# Patient Record
Sex: Female | Born: 1990 | Race: Black or African American | Hispanic: No | Marital: Single | Smoking: Former smoker
Health system: Southern US, Community
[De-identification: ages and names within clinical notes are randomized; demographics above are authoritative.]

## PROBLEM LIST (undated history)

## (undated) ENCOUNTER — Inpatient Hospital Stay (HOSPITAL_COMMUNITY): Payer: Self-pay

---

## 2010-05-07 ENCOUNTER — Emergency Department (HOSPITAL_COMMUNITY): Admission: EM | Admit: 2010-05-07 | Discharge: 2010-05-08 | Payer: Self-pay | Admitting: Emergency Medicine

## 2010-09-06 ENCOUNTER — Emergency Department (HOSPITAL_COMMUNITY)
Admission: EM | Admit: 2010-09-06 | Discharge: 2010-09-07 | Disposition: A | Discharge status: Home / Self Care | Payer: Managed Care, Other (non HMO) | Attending: Emergency Medicine | Admitting: Emergency Medicine

## 2010-09-06 DIAGNOSIS — M549 Dorsalgia, unspecified: Secondary | ICD-10-CM | POA: Insufficient documentation

## 2010-09-06 DIAGNOSIS — R51 Headache: Secondary | ICD-10-CM | POA: Insufficient documentation

## 2010-09-06 DIAGNOSIS — M25519 Pain in unspecified shoulder: Secondary | ICD-10-CM | POA: Insufficient documentation

## 2010-09-22 LAB — DIFFERENTIAL
Basophils Absolute: 0.1 10*3/uL (ref 0.0–0.1)
Eosinophils Relative: 1 % (ref 0–5)
Lymphocytes Relative: 27 % (ref 12–46)
Lymphs Abs: 2.3 10*3/uL (ref 0.7–4.0)
Monocytes Absolute: 0.8 10*3/uL (ref 0.1–1.0)
Neutro Abs: 5.3 10*3/uL (ref 1.7–7.7)

## 2010-09-22 LAB — CBC
HCT: 36.4 % (ref 36.0–46.0)
MCV: 86.5 fL (ref 78.0–100.0)
Platelets: 313 10*3/uL (ref 150–400)
RBC: 4.21 MIL/uL (ref 3.87–5.11)
WBC: 8.5 10*3/uL (ref 4.0–10.5)

## 2010-09-22 LAB — ABO/RH: ABO/RH(D): O POS

## 2010-09-22 LAB — RPR: RPR Ser Ql: NONREACTIVE

## 2010-09-22 LAB — HCG, QUANTITATIVE, PREGNANCY: hCG, Beta Chain, Quant, S: 3766 m[IU]/mL — ABNORMAL HIGH (ref <5)

## 2010-09-22 LAB — GC/CHLAMYDIA PROBE AMP, GENITAL: Chlamydia, DNA Probe: NEGATIVE

## 2010-09-22 LAB — WET PREP, GENITAL

## 2011-03-30 ENCOUNTER — Other Ambulatory Visit: Payer: Self-pay | Admitting: Internal Medicine

## 2011-03-30 ENCOUNTER — Other Ambulatory Visit (HOSPITAL_COMMUNITY)
Admission: RE | Admit: 2011-03-30 | Discharge: 2011-03-30 | Disposition: A | Discharge status: Home / Self Care | Payer: Managed Care, Other (non HMO) | Source: Ambulatory Visit | Attending: Internal Medicine | Admitting: Internal Medicine

## 2011-03-30 DIAGNOSIS — Z01419 Encounter for gynecological examination (general) (routine) without abnormal findings: Secondary | ICD-10-CM | POA: Insufficient documentation

## 2012-07-05 LAB — OB RESULTS CONSOLE GC/CHLAMYDIA
Chlamydia: NEGATIVE
Gonorrhea: NEGATIVE

## 2012-07-05 LAB — OB RESULTS CONSOLE RPR: RPR: NONREACTIVE

## 2012-07-05 LAB — OB RESULTS CONSOLE HIV ANTIBODY (ROUTINE TESTING): HIV: NONREACTIVE

## 2012-07-12 ENCOUNTER — Encounter (HOSPITAL_COMMUNITY): Payer: Self-pay | Admitting: Adult Health

## 2012-07-12 ENCOUNTER — Emergency Department (HOSPITAL_COMMUNITY)
Admission: EM | Admit: 2012-07-12 | Discharge: 2012-07-12 | Disposition: A | Discharge status: Home / Self Care | Payer: Managed Care, Other (non HMO) | Attending: Emergency Medicine | Admitting: Emergency Medicine

## 2012-07-12 DIAGNOSIS — O209 Hemorrhage in early pregnancy, unspecified: Secondary | ICD-10-CM | POA: Insufficient documentation

## 2012-07-12 DIAGNOSIS — O200 Threatened abortion: Secondary | ICD-10-CM | POA: Insufficient documentation

## 2012-07-12 DIAGNOSIS — Z87891 Personal history of nicotine dependence: Secondary | ICD-10-CM | POA: Insufficient documentation

## 2012-07-12 LAB — CBC
HCT: 36.4 % (ref 36.0–46.0)
Hemoglobin: 12.5 g/dL (ref 12.0–15.0)
Hemoglobin: 12.5 g/dL (ref 12.0–15.0)
MCH: 29.1 pg (ref 26.0–34.0)
MCH: 29.1 pg (ref 26.0–34.0)
MCHC: 34.3 g/dL (ref 30.0–36.0)
MCHC: 34.3 g/dL (ref 30.0–36.0)
MCV: 84.8 fL (ref 78.0–100.0)
MCV: 84.8 fL (ref 78.0–100.0)
RBC: 4.29 MIL/uL (ref 3.87–5.11)
RDW: 12.3 % (ref 11.5–15.5)

## 2012-07-12 LAB — HCG, QUANTITATIVE, PREGNANCY: hCG, Beta Chain, Quant, S: 37636 m[IU]/mL — ABNORMAL HIGH (ref <5)

## 2012-07-12 LAB — TYPE AND SCREEN: ABO/RH(D): O POS

## 2012-07-12 NOTE — ED Notes (Signed)
Unable to find [facility]

## 2012-07-12 NOTE — ED Provider Notes (Signed)
History     CSN: [id number]  Arrival date & time 07/12/12  0053   First MD Initiated Contact with Patient 07/12/12 0407      Chief Complaint  Patient presents with  . Vaginal Bleeding    (Consider location/radiation/quality/duration/timing/severity/associated sxs/prior treatment) HPI History provided by patient. Is [redacted] weeks pregnant followed by OB/GYN Dr. Jarold Motto, has had first ultrasound documented IUP. She is G3 P0 with 2 previous miscarriages.  Tonight noticed some spotting and mild vaginal bleeding. No associated cramping. No back pain. No dysuria. No fevers or chills. Symptoms mild/moderate in severity. Had similar presentation with previous miscarriages. No syncope. No dyspnea. No history of anemia. History reviewed. No pertinent past medical history.  History reviewed. No pertinent past surgical history.  History reviewed. No pertinent family history.  History  Substance Use Topics  . Smoking status: Former Games developer  . Smokeless tobacco: Not on file  . Alcohol Use: No    OB History    Grav Para Term Preterm Abortions TAB SAB Ect Mult Living                  Review of Systems  Constitutional: Negative for fever and chills.  HENT: Negative for neck pain and neck stiffness.   Eyes: Negative for pain.  Respiratory: Negative for shortness of breath.   Cardiovascular: Negative for chest pain.  Gastrointestinal: Negative for abdominal pain.  Genitourinary: Positive for vaginal bleeding. Negative for dysuria.  Musculoskeletal: Negative for back pain.  Skin: Negative for rash.  Neurological: Negative for headaches.  All other systems reviewed and are negative.    Allergies  Review of patient's allergies indicates no known allergies.  Home Medications  No current outpatient prescriptions on file.  BP 133/83  Pulse 83  Temp 98.1 F (36.7 C) (Oral)  Resp 18  SpO2 99%  Physical Exam  Constitutional: She is oriented to person, place, and time. She appears  well-developed and well-nourished.  HENT:  Head: Normocephalic and atraumatic.  Eyes: EOM are normal. Pupils are equal, round, and reactive to light.  Neck: Neck supple.  Cardiovascular: Normal rate, regular rhythm and intact distal pulses.   Pulmonary/Chest: Effort normal and breath sounds normal. No respiratory distress.  Abdominal: Soft. Bowel sounds are normal. She exhibits no distension. There is no tenderness. There is no rebound.  Musculoskeletal: Normal range of motion. She exhibits no edema.  Neurological: She is alert and oriented to person, place, and time.  Skin: Skin is warm and dry.    ED Course  [place] bedside Date/Time: 07/12/2012 4:24 AM Performed by: Sunnie Nielsen Authorized by: Sunnie Nielsen Consent: Verbal consent obtained. Consent given by: patient Patient understanding: patient states understanding of the procedure being performed Patient consent: the patient's understanding of the procedure matches consent given Procedure consent: procedure consent matches procedure scheduled Required items: required blood products, implants, devices, and special equipment available Patient identity confirmed: verbally with patient Time out: Immediately prior to procedure a "time out" was called to verify the correct patient, procedure, equipment, support staff and site/side marked as required. Comments: Transabdominal first trimester ultrasound performed. IUP identified with fetal heart rate 176. Fetal activity present. CRL consistent with stated dates   (including critical care time)  Results for orders placed during the hospital encounter of 07/12/12  CBC      Component Value Range   WBC 9.7  4.0 - 10.5 K/uL   RBC 4.29  3.87 - 5.11 MIL/uL   Hemoglobin 12.5  12.0 - 15.0  g/dL   HCT 09.8  11.9 - 14.7 %   MCV 84.8  78.0 - 100.0 fL   MCH 29.1  26.0 - 34.0 pg   MCHC 34.3  30.0 - 36.0 g/dL   RDW 82.9  56.2 - 13.0 %   Platelets 249  150 - 400 K/uL  HCG, QUANTITATIVE, PREGNANCY       Component Value Range   hCG, Beta Chain, Quant, [place] [address] (*) <5 mIU/mL  TYPE AND SCREEN      Component Value Range   ABO/RH(D) O POS     Antibody Screen NEG     Sample Expiration 07/15/2012     No abdominal pain or tenderness serial abdominal exams. Labs as above MDM   Vaginal bleeding first trimester, threatened miscarriage  Quantitative beta hCG and labs reviewed as above. Plan followup OB/GYN in 48 hours for recheck. Vital signs and nursing notes reviewed. Return precautions verbalized as understood.      Sunnie Nielsen, MD 07/12/12 [phone]

## 2012-07-12 NOTE — ED Notes (Signed)
Presents with [redacted] weeks pregnant vaginal bleeding,  moderate amount of blood on panty liner. Began at 11 pm, denies abdominal cramping, Hx of 2 miscarriages.

## 2012-07-12 NOTE — ED Notes (Signed)
Pt states she has has 2 miscarriages. Pt states she has had vaginal bleeding since 11 pm. Pt denies abdominal pain. Pt alert and oriented. Pt states she has bleed through 2 panty liners. Doppler performed  By 2 RNs. No fetal tones heard. MD notified. Ultrasound at bedside for MD

## 2012-09-17 ENCOUNTER — Inpatient Hospital Stay (HOSPITAL_COMMUNITY)
Admission: AD | Admit: 2012-09-17 | Discharge: 2012-09-19 | DRG: 781 | Disposition: A | Discharge status: Home / Self Care | Payer: Managed Care, Other (non HMO) | Source: Ambulatory Visit | Attending: Obstetrics and Gynecology | Admitting: Obstetrics and Gynecology

## 2012-09-17 ENCOUNTER — Encounter (HOSPITAL_COMMUNITY): Payer: Self-pay

## 2012-09-17 DIAGNOSIS — O10019 Pre-existing essential hypertension complicating pregnancy, unspecified trimester: Secondary | ICD-10-CM | POA: Diagnosis present

## 2012-09-17 DIAGNOSIS — O3430 Maternal care for cervical incompetence, unspecified trimester: Principal | ICD-10-CM | POA: Diagnosis present

## 2012-09-17 DIAGNOSIS — N883 Incompetence of cervix uteri: Secondary | ICD-10-CM

## 2012-09-17 DIAGNOSIS — O26879 Cervical shortening, unspecified trimester: Secondary | ICD-10-CM | POA: Diagnosis present

## 2012-09-17 LAB — CBC
HCT: 33.7 % — ABNORMAL LOW (ref 36.0–46.0)
MCHC: 33.5 g/dL (ref 30.0–36.0)
Platelets: 231 10*3/uL (ref 150–400)
RDW: 12.8 % (ref 11.5–15.5)
WBC: 10.2 10*3/uL (ref 4.0–10.5)

## 2012-09-17 MED ORDER — ACETAMINOPHEN 325 MG PO TABS: 650.0000 mg | ORAL_TABLET | ORAL | Status: DC | PRN

## 2012-09-17 MED ORDER — DOCUSATE SODIUM 100 MG PO CAPS
100.0000 mg | ORAL_CAPSULE | Freq: Every day | ORAL | Status: DC
  Filled 2012-09-17: qty 1

## 2012-09-17 MED ORDER — ZOLPIDEM TARTRATE 5 MG PO TABS: 5.0000 mg | ORAL_TABLET | Freq: Every evening | ORAL | Status: DC | PRN

## 2012-09-17 MED ORDER — SODIUM CHLORIDE 0.9 % IV SOLN
3.0000 g | Freq: Four times a day (QID) | INTRAVENOUS | Status: DC
  Administered 2012-09-17 – 2012-09-19 (×7): 3 g via INTRAVENOUS
  Filled 2012-09-17 (×10): qty 3

## 2012-09-17 MED ORDER — INDOMETHACIN 25 MG PO CAPS
25.0000 mg | ORAL_CAPSULE | Freq: Four times a day (QID) | ORAL | Status: DC
  Administered 2012-09-18 (×3): 25 mg via ORAL
  Filled 2012-09-17 (×7): qty 1

## 2012-09-17 MED ORDER — PRENATAL MULTIVITAMIN CH: 1.0000 | ORAL_TABLET | Freq: Every day | ORAL | Status: DC

## 2012-09-17 MED ORDER — CALCIUM CARBONATE ANTACID 500 MG PO CHEW: 2.0000 | CHEWABLE_TABLET | ORAL | Status: DC | PRN

## 2012-09-17 MED ORDER — INDOMETHACIN 50 MG PO CAPS
50.0000 mg | ORAL_CAPSULE | Freq: Once | ORAL | Status: AC
  Administered 2012-09-17: 50 mg via ORAL
  Filled 2012-09-17: qty 1

## 2012-09-17 MED ORDER — LACTATED RINGERS IV SOLN
INTRAVENOUS | Status: DC
  Administered 2012-09-17: 125 mL via INTRAVENOUS
  Administered 2012-09-18 – 2012-09-19 (×5): via INTRAVENOUS

## 2012-09-17 NOTE — Progress Notes (Signed)
Attempt made to place peripheral IV by Mickie Bail, RN. Attempt was unsuccessful.  Pt is a difficult stick. Will notify anesthesia and request to come.

## 2012-09-17 NOTE — Progress Notes (Signed)
Notified anesthesia of need for IV placement. Currently in OR for 3 c/s.  Will be out asap

## 2012-09-17 NOTE — Plan of Care (Signed)
Problem: Consults Goal: Birthing Suites Patient Information Press F2 to bring up selections list   Pt < [redacted] weeks EGA     

## 2012-09-17 NOTE — H&P (Signed)
Natalie Dunn is a 22 y.o. female G3P0020 at 19+ weeks (EDD 02/07/13 by 8 week [place]) presenting for finding of incompetent cervix with cervix open from internal os down to level of exteernal os,  Normal anatomy and growth.  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  Pt denies LOF or VB or cramping. History Past OB Hx SAB x 2  History reviewed. No pertinent past medical history. History reviewed. No pertinent past surgical history. Family History: family history is not on file. Social History:  reports that she has quit smoking. She does not have any smokeless tobacco history on file. She reports that she does not drink alcohol or use illicit drugs.   Prenatal Transfer Tool  Maternal Diabetes: unknown Genetic Screening: Declined Maternal Ultrasounds/Referrals: Abnormal:  Findings:   Other:cervical funelling Fetal Ultrasounds or other Referrals:  Referred to Materal Fetal Medicine  Maternal Substance Abuse:  No Significant Maternal Medications:  None Significant Maternal Lab Results:  None Other Comments:  None  ROS    Blood pressure 126/73, pulse 79, temperature 99.1 F (37.3 C), temperature source Oral, resp. rate 18, height 5\' 7"  (1.702 m), weight 92.987 kg (205 lb), last menstrual period 05/03/2012. Exam Physical Exam  Constitutional: She is oriented to person, place, and time. She appears well-developed and well-nourished.  Cardiovascular: Normal rate and regular rhythm.   Respiratory: Effort normal and breath sounds normal.  GI: Soft.  Genitourinary: Vagina normal and uterus normal.  Neurological: She is alert and oriented to person, place, and time.  Psychiatric: She has a normal mood and affect. Her behavior is normal.   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  Will try indocin and rescan tomorrow and plan on cerclage placement if MFM agrees.  Unasyn for prophylaxis until cerclage.  Natalie Dunn 09/17/2012, 7:28 PM

## 2012-09-18 ENCOUNTER — Inpatient Hospital Stay (HOSPITAL_COMMUNITY): Payer: Managed Care, Other (non HMO)

## 2012-09-18 NOTE — Progress Notes (Signed)
Patient ID: Natalie Dunn, female   DOB: 1991/01/28, 22 y.o.   MRN: [id number] HD #1/2 Incompetent cervix 19+weeks  Pt rested overnight in trendelenberg No LOF or VB  afeb vss Having repeat US at bedside currently to assess cervix  Pt NPO for now. Plan cerclage today or tomorrow pending [place] results and OR availability.

## 2012-09-18 NOTE — Progress Notes (Signed)
[facility] ultrasound  Indication: 22 yr old G3P0020 at [redacted]w[redacted]d with cervical shortening for ultrasound.  Findings: 1. Single intrauterine pregnancy. 2. Fetal biometry is consistent with dating. 3. Anterior placenta without evidence of previa. 4. Normal amniotic fluid volume. 5. The transvaginal cervical length is shortened to 1cm. There is funneling seen at the internal cervical os. There is debris seen in the endocervical canal. 6. The limited anatomy survey is normal.  Recommendations: 1. Appropriate fetal growth. 2. Normal limited anatomy survey: - recommend complete anatomic survey if not already performed 3. Shortened cervix: - no consult requested - patient is scheduled for a cerclage - recommend reduced activity - recommend close surveillance for signs/symptoms of PPROM/ preterm labor - consider course of betamethasone at viability  Results given to Dr. Okey Dupre, MD

## 2012-09-18 NOTE — Plan of Care (Signed)
Cerclage scheduled for 10 am 3/12.  Consent obtained and in chart; Dr Jean Rosenthal notified of posting, no orders received.

## 2012-09-19 ENCOUNTER — Inpatient Hospital Stay (HOSPITAL_COMMUNITY): Payer: Managed Care, Other (non HMO) | Admitting: Anesthesiology

## 2012-09-19 ENCOUNTER — Encounter (HOSPITAL_COMMUNITY): Payer: Self-pay | Admitting: *Deleted

## 2012-09-19 ENCOUNTER — Encounter (HOSPITAL_COMMUNITY)
Admission: AD | Disposition: A | Discharge status: Home / Self Care | Payer: Self-pay | Source: Ambulatory Visit | Attending: Obstetrics and Gynecology

## 2012-09-19 ENCOUNTER — Encounter (HOSPITAL_COMMUNITY): Payer: Self-pay | Admitting: Anesthesiology

## 2012-09-19 DIAGNOSIS — N883 Incompetence of cervix uteri: Secondary | ICD-10-CM | POA: Diagnosis present

## 2012-09-19 HISTORY — PX: CERVICAL CERCLAGE: SHX1329

## 2012-09-19 SURGERY — CERCLAGE, CERVIX, VAGINAL APPROACH
Anesthesia: Spinal | Site: Vagina | Wound class: Clean Contaminated

## 2012-09-19 MED ORDER — 0.9 % SODIUM CHLORIDE (POUR BTL) OPTIME
TOPICAL | Status: DC | PRN
  Administered 2012-09-19: 1000 mL

## 2012-09-19 MED ORDER — ONDANSETRON HCL 4 MG/2ML IJ SOLN
INTRAMUSCULAR | Status: DC | PRN
  Administered 2012-09-19: 4 mg via INTRAVENOUS

## 2012-09-19 MED ORDER — FENTANYL CITRATE 0.05 MG/ML IJ SOLN: 25.0000 ug | INTRAMUSCULAR | Status: DC | PRN

## 2012-09-19 MED ORDER — PHENYLEPHRINE 40 MCG/ML (10ML) SYRINGE FOR IV PUSH (FOR BLOOD PRESSURE SUPPORT)
PREFILLED_SYRINGE | INTRAVENOUS | Status: AC
  Filled 2012-09-19: qty 5

## 2012-09-19 MED ORDER — DSS 100 MG PO CAPS: 100.0000 mg | ORAL_CAPSULE | Freq: Two times a day (BID) | ORAL | Status: DC

## 2012-09-19 MED ORDER — ONDANSETRON HCL 4 MG/2ML IJ SOLN
INTRAMUSCULAR | Status: AC
  Filled 2012-09-19: qty 2

## 2012-09-19 MED ORDER — LIDOCAINE IN DEXTROSE 5-7.5 % IV SOLN
INTRAVENOUS | Status: AC
  Filled 2012-09-19: qty 2

## 2012-09-19 MED ORDER — EPHEDRINE 5 MG/ML INJ
INTRAVENOUS | Status: AC
  Filled 2012-09-19: qty 10

## 2012-09-19 MED ORDER — EPHEDRINE SULFATE 50 MG/ML IJ SOLN
INTRAMUSCULAR | Status: DC | PRN
  Administered 2012-09-19: 5 mg via INTRAVENOUS
  Administered 2012-09-19: 10 mg via INTRAVENOUS
  Administered 2012-09-19 (×2): 5 mg via INTRAVENOUS

## 2012-09-19 MED ORDER — STERILE WATER FOR IRRIGATION IR SOLN
Status: DC | PRN
  Administered 2012-09-19: 1000 mL via INTRAVESICAL

## 2012-09-19 MED ORDER — PHENYLEPHRINE HCL 10 MG/ML IJ SOLN
INTRAMUSCULAR | Status: DC | PRN
  Administered 2012-09-19 (×4): 40 ug via INTRAVENOUS
  Administered 2012-09-19: 80 ug via INTRAVENOUS
  Administered 2012-09-19: 40 ug via INTRAVENOUS
  Administered 2012-09-19: 80 ug via INTRAVENOUS
  Administered 2012-09-19: 40 ug via INTRAVENOUS

## 2012-09-19 SURGICAL SUPPLY — 16 items
CATH ROBINSON RED A/P 16FR (CATHETERS) IMPLANT
CLOTH BEACON ORANGE TIMEOUT ST (SAFETY) ×2 IMPLANT
COUNTER NEEDLE 1200 MAGNETIC (NEEDLE) IMPLANT
GLOVE BIO SURGEON STRL SZ 6.5 (GLOVE) ×2 IMPLANT
GOWN STRL REIN XL XLG (GOWN DISPOSABLE) ×4 IMPLANT
NEEDLE MAYO .5 CIRCLE (NEEDLE) ×2 IMPLANT
PACK VAGINAL MINOR WOMEN LF (CUSTOM PROCEDURE TRAY) ×2 IMPLANT
PAD OB MATERNITY 4.3X12.25 (PERSONAL CARE ITEMS) ×2 IMPLANT
PAD PREP 24X48 CUFFED NSTRL (MISCELLANEOUS) ×2 IMPLANT
SET CYSTO W/LG BORE CLAMP LF (SET/KITS/TRAYS/PACK) ×2 IMPLANT
SUT POLYDEK 5 CE 75 36 (SUTURE) ×4 IMPLANT
SYR BULB IRRIGATION 50ML (SYRINGE) ×2 IMPLANT
TOWEL OR 17X24 6PK STRL BLUE (TOWEL DISPOSABLE) ×4 IMPLANT
TUBING NON-CON 1/4 X 20 CONN (TUBING) ×2 IMPLANT
WATER STERILE IRR 1000ML POUR (IV SOLUTION) ×2 IMPLANT
YANKAUER SUCT BULB TIP NO VENT (SUCTIONS) ×2 IMPLANT

## 2012-09-19 NOTE — Brief Op Note (Signed)
09/17/2012 - 09/19/2012  11:36 AM  PATIENT:  Natalie Dunn  22 y.o. female  PRE-OPERATIVE DIAGNOSIS:  incompetent cervix with funneling  POST-OPERATIVE DIAGNOSIS:  incomplete cervix with funneling  PROCEDURE:  Procedure(s): CERCLAGE CERVICAL (N/A)  SURGEON:  Surgeon(s) and Role:    * Oliver Pila, MD - Primary   ANESTHESIA:   spinal  EBL:  Total I/O In: 2300 [I.V.:2300] Out: 600 [Urine:600]  BLOOD ADMINISTERED:none  DRAINS: none   LOCAL MEDICATIONS USED:  NONE  SPECIMEN:  No Specimen  DISPOSITION OF SPECIMEN:  N/A  COUNTS:  YES  TOURNIQUET:  * No tourniquets in log *  DICTATION: .Dragon Dictation  PLAN OF CARE: Discharge to home after PACU  PATIENT DISPOSITION:  PACU - hemodynamically stable.

## 2012-09-19 NOTE — Transfer of Care (Signed)
Immediate Anesthesia Transfer of Care Note  Patient: Matha Masse  Procedure(s) Performed: Procedure(s): CERCLAGE CERVICAL (N/A)  Patient Location: PACU  Anesthesia Type:Spinal  Level of Consciousness: awake, alert  and oriented  Airway & Oxygen Therapy: Patient Spontanous Breathing  Post-op Assessment: Report given to PACU RN and Post -op Vital signs reviewed and stable  Post vital signs: Reviewed and stable  Complications: No apparent anesthesia complications

## 2012-09-19 NOTE — Progress Notes (Signed)
Patient ID: Natalie Dunn, female   DOB: 07-02-1991, 22 y.o.   MRN: [id number] Pt with uneventful night. Cerclage for 10am.  Questions answered. Will proceed.

## 2012-09-19 NOTE — Anesthesia Preprocedure Evaluation (Signed)

## 2012-09-19 NOTE — Anesthesia Postprocedure Evaluation (Signed)
  Anesthesia Post-op Note  Patient: Natalie Dunn  Procedure(s) Performed: Procedure(s): CERCLAGE CERVICAL (N/A)  Patient Location: PACU  Anesthesia Type:Spinal  Level of Consciousness: awake, alert  and oriented  Airway and Oxygen Therapy: Patient Spontanous Breathing  Post-op Pain: none  Post-op Assessment: Post-op Vital signs reviewed, Patient's Cardiovascular Status Stable, Respiratory Function Stable, Patent Airway, No signs of Nausea or vomiting, Pain level controlled, No headache, No backache and No residual numbness  Post-op Vital Signs: Reviewed and stable  Complications: No apparent anesthesia complications

## 2012-09-19 NOTE — Discharge Summary (Signed)
Physician Discharge Summary  Patient ID: Ronneisha Jett MRN: [id number] DOB/AGE: December 23, 1990 21 y.o.  Admit date: 09/17/2012 Discharge date: 09/19/2012  Admission Diagnoses: 19 weeks with incompetent cervix  Discharge Diagnoses:  Active Problems:   Incompetent cervix s/p Cerclage  Discharged Condition: good  Hospital Course: Pt admitted and placed in trendelenberg and placed on indocin and antibiotics for 24 hours.  She had an MFM consult that agreed with cerclage.  She underwent a Macdonald cerclage 09/19/12 without complication.  The cervix appeared closed at the conclusion of the procedure.  She will be observed in the PACU and d/c to home if does well.  Consults: MFM  Significant Diagnostic Studies: Ultrasound  Treatments: antibiotics: Unasyn  Discharge Exam: Blood pressure 106/56, pulse 81, temperature 98.9 F (37.2 C), temperature source Oral, resp. rate 20, height 5\' 7"  (1.702 m), weight 92.987 kg (205 lb), last menstrual period 05/03/2012. General appearance: alert and cooperative GI: soft, non-tender; bowel sounds normal; no masses,  no organomegaly  Disposition: 01-Home or Self Care  Discharge Orders   Future Orders Complete By Expires     Diet - low sodium heart healthy  As directed     Discharge instructions  As directed     Comments:      You are being discharged on bedrest and pelvic rest.  You should limit the time on your feet to no more than 15 minutes at a time.  No sex or anything that would cause orgasm.  Keep your appointment as scheduled in the office in the next week.    Increase activity slowly  As directed         Medication List    TAKE these medications       DSS 100 MG Caps  Take 100 mg by mouth 2 (two) times daily.     prenatal multivitamin Tabs  Take 1 tablet by mouth daily at 12 noon.           Follow-up Information   Follow up with Oliver Pila, MD In 1 week. (keep appt as scheduled)    Contact information:   [address], [address] [place] [address] [phone]       Signed: Oliver Pila 09/19/2012, 11:50 AM

## 2012-09-19 NOTE — Op Note (Signed)
Operative note  Preoperative diagnosis Pregnancy at 19 weeks and 6 days Incompetent cervix with funneling down to within 1 cm of the external os  Postoperative diagnosis Same  Procedure Cervical cerclage--MacDonald with suture knot at 12:00  Surgeon Dr. Huel Cote  Anesthesia Spinal  Fluids Estimated blood loss 50 cc Urine output 300 cc catheter IV fluids 1200 cc LR  Findings The cervix appeared shortened but still a proximally 1-1-1/2 cm in length. There was some mucus coming out of the external os but no visible membranes or bag.  There is no obvious leakage of fluid during the procedure.  Procedure note Patient was taken to the operating room where spinal anesthesia was obtained without difficulty she was then prepped and draped in the normal sterile fashion in the dorsal lithotomy position. An appropriate time out was performed. Cervical visualization was difficult secondary to the length of the patient's vagina, therefore sometime was spent findings appropriate retractors to gain visualization. With Deaver retractors anteriorly and to the side and a long Graves speculum posteriorly, we began to place a cerclage with a #1 Polydek suture on a Mayo needle. Starting at 12:00 the stitch was passed circumferentially around the cervix and secured at that position. Once the stitch was secured the cervix appeared closed and palpated closed. There is no active bleeding at the conclusion of the procedure and the patient tolerated it well. The patient was taken to the recovery room where she will recover and it does fine be discharged home on bedrest.

## 2012-09-19 NOTE — Anesthesia Procedure Notes (Addendum)
Spinal  Patient location during procedure: OR Preanesthetic Checklist Completed: patient identified, site marked, surgical consent, pre-op evaluation, timeout performed, IV checked, risks and benefits discussed and monitors and equipment checked Spinal Block Patient position: sitting Prep: DuraPrep Patient monitoring: heart rate, cardiac monitor, continuous pulse ox and blood pressure Approach: midline Location: L3-4 Injection technique: single-shot Needle Needle type: Sprotte  Needle gauge: 24 G Needle length: 9 cm Assessment Sensory level: T4 Events: paresthesia Additional Notes Left leg parasthesia noted, transient sx cleared before next pass of sprotte  Spinal Dosage in OR  Xylocaine ml       1.2

## 2012-09-20 ENCOUNTER — Encounter (HOSPITAL_COMMUNITY): Payer: Self-pay | Admitting: Obstetrics and Gynecology

## 2012-10-18 ENCOUNTER — Inpatient Hospital Stay (HOSPITAL_COMMUNITY)
Admission: RE | Admit: 2012-10-18 | Discharge: 2012-10-18 | Disposition: A | Discharge status: Home / Self Care | Payer: Managed Care, Other (non HMO) | Source: Ambulatory Visit | Attending: Obstetrics and Gynecology | Admitting: Obstetrics and Gynecology

## 2012-10-18 DIAGNOSIS — O4700 False labor before 37 completed weeks of gestation, unspecified trimester: Secondary | ICD-10-CM | POA: Insufficient documentation

## 2012-10-18 DIAGNOSIS — O3430 Maternal care for cervical incompetence, unspecified trimester: Secondary | ICD-10-CM | POA: Insufficient documentation

## 2012-10-18 MED ORDER — BETAMETHASONE SOD PHOS & ACET 6 (3-3) MG/ML IJ SUSP
12.0000 mg | Freq: Once | INTRAMUSCULAR | Status: AC
  Administered 2012-10-18: 12 mg via INTRAMUSCULAR
  Filled 2012-10-18: qty 2

## 2012-10-19 ENCOUNTER — Inpatient Hospital Stay (HOSPITAL_COMMUNITY)
Admission: AD | Admit: 2012-10-19 | Discharge: 2012-10-19 | Disposition: A | Discharge status: Home / Self Care | Payer: Managed Care, Other (non HMO) | Source: Ambulatory Visit | Attending: Obstetrics and Gynecology | Admitting: Obstetrics and Gynecology

## 2012-10-19 DIAGNOSIS — O4700 False labor before 37 completed weeks of gestation, unspecified trimester: Secondary | ICD-10-CM | POA: Insufficient documentation

## 2012-10-19 MED ORDER — BETAMETHASONE SOD PHOS & ACET 6 (3-3) MG/ML IJ SUSP
12.0000 mg | Freq: Once | INTRAMUSCULAR | Status: AC
  Administered 2012-10-19: 12 mg via INTRAMUSCULAR
  Filled 2012-10-19: qty 2

## 2012-11-11 ENCOUNTER — Encounter (HOSPITAL_COMMUNITY): Payer: Self-pay | Admitting: *Deleted

## 2012-11-11 ENCOUNTER — Inpatient Hospital Stay (HOSPITAL_COMMUNITY)
Admission: AD | Admit: 2012-11-11 | Discharge: 2012-11-12 | Disposition: A | Discharge status: Home / Self Care | Payer: Managed Care, Other (non HMO) | Source: Ambulatory Visit | Attending: Obstetrics and Gynecology | Admitting: Obstetrics and Gynecology

## 2012-11-11 DIAGNOSIS — R109 Unspecified abdominal pain: Secondary | ICD-10-CM | POA: Insufficient documentation

## 2012-11-11 DIAGNOSIS — B9689 Other specified bacterial agents as the cause of diseases classified elsewhere: Secondary | ICD-10-CM | POA: Insufficient documentation

## 2012-11-11 DIAGNOSIS — N760 Acute vaginitis: Secondary | ICD-10-CM | POA: Insufficient documentation

## 2012-11-11 DIAGNOSIS — O2390 Unspecified genitourinary tract infection in pregnancy, unspecified trimester: Secondary | ICD-10-CM | POA: Insufficient documentation

## 2012-11-11 DIAGNOSIS — A499 Bacterial infection, unspecified: Secondary | ICD-10-CM | POA: Insufficient documentation

## 2012-11-11 DIAGNOSIS — O26879 Cervical shortening, unspecified trimester: Secondary | ICD-10-CM | POA: Insufficient documentation

## 2012-11-11 DIAGNOSIS — N949 Unspecified condition associated with female genital organs and menstrual cycle: Secondary | ICD-10-CM | POA: Insufficient documentation

## 2012-11-11 LAB — URINALYSIS, ROUTINE W REFLEX MICROSCOPIC
Bilirubin Urine: NEGATIVE
Glucose, UA: NEGATIVE mg/dL
Ketones, ur: 15 mg/dL — AB
pH: 6.5 (ref 5.0–8.0)

## 2012-11-11 LAB — URINE MICROSCOPIC-ADD ON

## 2012-11-11 NOTE — MAU Note (Signed)
Pt states she has been feeling pelvic pain x 3 hours-it is constant

## 2012-11-11 NOTE — MAU Provider Note (Signed)
History     CSN: [id number]  Arrival date and time: 11/11/12 2249   First Provider Initiated Contact with Patient 11/11/12 2331      Chief Complaint  Patient presents with  . Pelvic Pain   HPI  Pt is a G3P0020 here at 27.3 wks IUP with report of left pelvic pain that started today.  Report pain worsens with ambulation and standing.  Pain is constant, not intermittent in nature.   No report of vaginal bleeding.  +yellowish discharge that required a pantyliner.  +cerclage that was placed at 17 wks IUP for shortened cervix.    Past Medical History  Diagnosis Date  . Medical history non-contributory     Past Surgical History  Procedure Laterality Date  . Cervical cerclage N/A 09/19/2012    Procedure: CERCLAGE CERVICAL;  Surgeon: Oliver Pila, MD;  Location: [facility];  Service: Gynecology;  Laterality: N/A;    History reviewed. No pertinent family history.  History  Substance Use Topics  . Smoking status: Former Games developer  . Smokeless tobacco: Not on file  . Alcohol Use: No    Allergies: No Known Allergies  Prescriptions prior to admission  Medication Sig Dispense Refill  . Prenatal Vit-Fe Fumarate-FA (PRENATAL MULTIVITAMIN) TABS Take 1 tablet by mouth daily at 12 noon.        Review of Systems  Gastrointestinal: Positive for abdominal pain (groin).  Genitourinary:       Yellowish discharge  All other systems reviewed and are negative.   Physical Exam   Blood pressure 128/96, pulse 80, temperature 98.2 F (36.8 C), temperature source Oral, resp. rate 20, height 5\' 7"  (1.702 m), weight 99.61 kg (219 lb 9.6 oz), last menstrual period 05/03/2012.  Physical Exam  Constitutional: She is oriented to person, place, and time. She appears well-developed and well-nourished. No distress.  HENT:  Head: Normocephalic.  Neck: Normal range of motion. Neck supple.  Cardiovascular: Normal rate, regular rhythm and normal heart sounds.   Respiratory: Effort normal and breath  sounds normal.  GI: Soft. There is no tenderness.  Genitourinary: No bleeding around the vagina. Vaginal discharge (white, watery discharge; fern negative) found.  Cerclage in place; cervix visually closed  Neurological: She is alert and oriented to person, place, and time.  Skin: Skin is warm and dry.    MAU Course  Procedures  Results for orders placed during the hospital encounter of 11/11/12 (from the past 24 hour(s))  URINALYSIS, ROUTINE W REFLEX MICROSCOPIC     Status: Abnormal   Collection Time    11/11/12 11:05 PM      Result Value Range   Color, Urine YELLOW  YELLOW   APPearance CLEAR  CLEAR   Specific Gravity, Urine >1.030 (*) 1.005 - 1.030   pH 6.5  5.0 - 8.0   Glucose, UA NEGATIVE  NEGATIVE mg/dL   Hgb urine dipstick NEGATIVE  NEGATIVE   Bilirubin Urine NEGATIVE  NEGATIVE   Ketones, ur 15 (*) NEGATIVE mg/dL   Protein, ur NEGATIVE  NEGATIVE mg/dL   Urobilinogen, UA 0.2  0.0 - 1.0 mg/dL   Nitrite NEGATIVE  NEGATIVE   Leukocytes, UA SMALL (*) NEGATIVE  URINE MICROSCOPIC-ADD ON     Status: Abnormal   Collection Time    11/11/12 11:05 PM      Result Value Range   Squamous Epithelial / LPF FEW (*) RARE   WBC, UA 3-6  <3 WBC/hpf   Urine-Other MUCOUS PRESENT     Consulted with Dr.  Meisinger > reviewed HPI/ob history/exam > obtain amnisure Results for orders placed during the hospital encounter of 11/11/12 (from the past 24 hour(s))  URINALYSIS, ROUTINE W REFLEX MICROSCOPIC     Status: Abnormal   Collection Time    11/11/12 11:05 PM      Result Value Range   Color, Urine YELLOW  YELLOW   APPearance CLEAR  CLEAR   Specific Gravity, Urine >1.030 (*) 1.005 - 1.030   pH 6.5  5.0 - 8.0   Glucose, UA NEGATIVE  NEGATIVE mg/dL   Hgb urine dipstick NEGATIVE  NEGATIVE   Bilirubin Urine NEGATIVE  NEGATIVE   Ketones, ur 15 (*) NEGATIVE mg/dL   Protein, ur NEGATIVE  NEGATIVE mg/dL   Urobilinogen, UA 0.2  0.0 - 1.0 mg/dL   Nitrite NEGATIVE  NEGATIVE   Leukocytes, UA SMALL  (*) NEGATIVE  URINE MICROSCOPIC-ADD ON     Status: Abnormal   Collection Time    11/11/12 11:05 PM      Result Value Range   Squamous Epithelial / LPF FEW (*) RARE   WBC, UA 3-6  <3 WBC/hpf   Urine-Other MUCOUS PRESENT    WET PREP, GENITAL     Status: Abnormal   Collection Time    11/11/12 11:52 PM      Result Value Range   Yeast Wet Prep HPF POC NONE SEEN  NONE SEEN   Trich, Wet Prep NONE SEEN  NONE SEEN   Clue Cells Wet Prep HPF POC FEW (*) NONE SEEN   WBC, Wet Prep HPF POC MODERATE (*) NONE SEEN  AMNISURE RUPTURE OF MEMBRANE (ROM)     Status: None   Collection Time    11/12/12 12:10 AM      Result Value Range   Amnisure ROM NEGATIVE     FHR 130's, + 10x10 accels Toco - mild irritability (not felt by pt) Assessment and Plan  Bacterial Vaginosis Round Ligament Pain  Plan: RX Flagyl x 1 week Follow-up in office as scheduled   Upon leaving pt blood pressures noticed to be in the 130's over 90's; no headache, vision changes or epigastric pain > consulted with Dr. Jackelyn Knife > obtain labs and have follow-up in office this week.  [facility] 11/11/2012, 11:32 PM

## 2012-11-12 DIAGNOSIS — N760 Acute vaginitis: Secondary | ICD-10-CM

## 2012-11-12 DIAGNOSIS — A499 Bacterial infection, unspecified: Secondary | ICD-10-CM

## 2012-11-12 LAB — CBC
Hemoglobin: 11.2 g/dL — ABNORMAL LOW (ref 12.0–15.0)
MCH: 29.2 pg (ref 26.0–34.0)
MCHC: 33.5 g/dL (ref 30.0–36.0)
MCV: 87.2 fL (ref 78.0–100.0)

## 2012-11-12 LAB — COMPREHENSIVE METABOLIC PANEL
ALT: 18 U/L (ref 0–35)
BUN: 8 mg/dL (ref 6–23)
Calcium: 9.4 mg/dL (ref 8.4–10.5)
Creatinine, Ser: 0.65 mg/dL (ref 0.50–1.10)
GFR calc Af Amer: >90 mL/min (ref >90)
Glucose, Bld: 106 mg/dL — ABNORMAL HIGH (ref 70–99)
Sodium: 134 mEq/L — ABNORMAL LOW (ref 135–145)
Total Protein: 6 g/dL (ref 6.0–8.3)

## 2012-11-12 LAB — WET PREP, GENITAL: Yeast Wet Prep HPF POC: NONE SEEN

## 2012-11-12 MED ORDER — METRONIDAZOLE 500 MG PO TABS: 500.0000 mg | ORAL_TABLET | Freq: Two times a day (BID) | ORAL | Status: DC

## 2012-11-14 ENCOUNTER — Inpatient Hospital Stay (HOSPITAL_COMMUNITY): Payer: Managed Care, Other (non HMO) | Admitting: Anesthesiology

## 2012-11-14 ENCOUNTER — Encounter (HOSPITAL_COMMUNITY): Payer: Self-pay | Admitting: Anesthesiology

## 2012-11-14 ENCOUNTER — Inpatient Hospital Stay (HOSPITAL_COMMUNITY)
Admission: AD | Admit: 2012-11-14 | Discharge: 2012-11-16 | DRG: 775 | Disposition: A | Discharge status: Home / Self Care | Payer: Managed Care, Other (non HMO) | Source: Ambulatory Visit | Attending: Obstetrics and Gynecology | Admitting: Obstetrics and Gynecology

## 2012-11-14 ENCOUNTER — Inpatient Hospital Stay (HOSPITAL_COMMUNITY): Payer: Managed Care, Other (non HMO)

## 2012-11-14 ENCOUNTER — Encounter (HOSPITAL_COMMUNITY): Payer: Self-pay | Admitting: *Deleted

## 2012-11-14 DIAGNOSIS — O4290 Premature rupture of membranes, unspecified as to length of time between rupture and onset of labor, unspecified weeks of gestation: Principal | ICD-10-CM | POA: Diagnosis present

## 2012-11-14 DIAGNOSIS — O26879 Cervical shortening, unspecified trimester: Secondary | ICD-10-CM | POA: Diagnosis present

## 2012-11-14 LAB — CBC
MCH: 29.4 pg (ref 26.0–34.0)
MCV: 85.8 fL (ref 78.0–100.0)
Platelets: 241 10*3/uL (ref 150–400)
RDW: 13.1 % (ref 11.5–15.5)

## 2012-11-14 LAB — POCT FERN TEST

## 2012-11-14 LAB — TYPE AND SCREEN: Antibody Screen: NEGATIVE

## 2012-11-14 MED ORDER — AZITHROMYCIN 500 MG PO TABS
500.0000 mg | ORAL_TABLET | Freq: Every day | ORAL | Status: DC
  Administered 2012-11-14: 500 mg via ORAL
  Filled 2012-11-14 (×2): qty 1

## 2012-11-14 MED ORDER — IBUPROFEN 600 MG PO TABS
600.0000 mg | ORAL_TABLET | Freq: Four times a day (QID) | ORAL | Status: DC | PRN
  Administered 2012-11-15: 600 mg via ORAL

## 2012-11-14 MED ORDER — EPHEDRINE 5 MG/ML INJ
10.0000 mg | INTRAVENOUS | Status: DC | PRN
  Filled 2012-11-14: qty 4
  Filled 2012-11-14: qty 2

## 2012-11-14 MED ORDER — OXYTOCIN 40 UNITS IN LACTATED RINGERS INFUSION - SIMPLE MED
1.0000 m[IU]/min | INTRAVENOUS | Status: DC
  Administered 2012-11-14: 1 m[IU]/min via INTRAVENOUS

## 2012-11-14 MED ORDER — OXYTOCIN BOLUS FROM INFUSION
500.0000 mL | INTRAVENOUS | Status: DC
  Administered 2012-11-14: 500 mL via INTRAVENOUS

## 2012-11-14 MED ORDER — SODIUM CHLORIDE 0.9 % IV SOLN: 2.0000 g | Freq: Four times a day (QID) | INTRAVENOUS | Status: DC

## 2012-11-14 MED ORDER — BETAMETHASONE SOD PHOS & ACET 6 (3-3) MG/ML IJ SUSP
12.0000 mg | INTRAMUSCULAR | Status: DC
  Administered 2012-11-14: 12 mg via INTRAMUSCULAR
  Filled 2012-11-14 (×2): qty 2

## 2012-11-14 MED ORDER — DOCUSATE SODIUM 100 MG PO CAPS: 100.0000 mg | ORAL_CAPSULE | Freq: Every day | ORAL | Status: DC

## 2012-11-14 MED ORDER — LACTATED RINGERS IV SOLN: 500.0000 mL | INTRAVENOUS | Status: DC | PRN

## 2012-11-14 MED ORDER — PHENYLEPHRINE 40 MCG/ML (10ML) SYRINGE FOR IV PUSH (FOR BLOOD PRESSURE SUPPORT)
80.0000 ug | PREFILLED_SYRINGE | INTRAVENOUS | Status: DC | PRN
  Filled 2012-11-14: qty 2

## 2012-11-14 MED ORDER — TERBUTALINE SULFATE 1 MG/ML IJ SOLN
0.2500 mg | Freq: Once | INTRAMUSCULAR | Status: AC
  Administered 2012-11-14: 0.25 mg via SUBCUTANEOUS
  Filled 2012-11-14: qty 1

## 2012-11-14 MED ORDER — PHENYLEPHRINE 40 MCG/ML (10ML) SYRINGE FOR IV PUSH (FOR BLOOD PRESSURE SUPPORT)
80.0000 ug | PREFILLED_SYRINGE | INTRAVENOUS | Status: DC | PRN
  Filled 2012-11-14: qty 5
  Filled 2012-11-14: qty 2

## 2012-11-14 MED ORDER — LACTATED RINGERS IV SOLN
INTRAVENOUS | Status: DC
  Administered 2012-11-14: 20:00:00 via INTRAVENOUS
  Administered 2012-11-14: 500 mL via INTRAVENOUS
  Administered 2012-11-14: 1000 mL via INTRAVENOUS

## 2012-11-14 MED ORDER — ACETAMINOPHEN 325 MG PO TABS: 650.0000 mg | ORAL_TABLET | ORAL | Status: DC | PRN

## 2012-11-14 MED ORDER — MAGNESIUM SULFATE BOLUS VIA INFUSION
6.0000 g | Freq: Once | INTRAVENOUS | Status: DC
  Filled 2012-11-14: qty 500

## 2012-11-14 MED ORDER — FENTANYL 2.5 MCG/ML BUPIVACAINE 1/10 % EPIDURAL INFUSION (WH - ANES)
14.0000 mL/h | INTRAMUSCULAR | Status: DC | PRN
  Administered 2012-11-14: 14 mL/h via EPIDURAL
  Filled 2012-11-14: qty 125

## 2012-11-14 MED ORDER — AMOXICILLIN 500 MG PO CAPS: 500.0000 mg | ORAL_CAPSULE | Freq: Three times a day (TID) | ORAL | Status: DC

## 2012-11-14 MED ORDER — OXYCODONE-ACETAMINOPHEN 5-325 MG PO TABS: 1.0000 | ORAL_TABLET | ORAL | Status: DC | PRN

## 2012-11-14 MED ORDER — OXYTOCIN 40 UNITS IN LACTATED RINGERS INFUSION - SIMPLE MED: 62.5000 mL/h | INTRAVENOUS | Status: DC

## 2012-11-14 MED ORDER — LACTATED RINGERS IV SOLN: 500.0000 mL | Freq: Once | INTRAVENOUS | Status: DC

## 2012-11-14 MED ORDER — OXYTOCIN 40 UNITS IN LACTATED RINGERS INFUSION - SIMPLE MED
INTRAVENOUS | Status: AC
  Filled 2012-11-14: qty 1000

## 2012-11-14 MED ORDER — SODIUM BICARBONATE 8.4 % IV SOLN
INTRAVENOUS | Status: DC | PRN
  Administered 2012-11-14: 5 mL via EPIDURAL

## 2012-11-14 MED ORDER — DIPHENHYDRAMINE HCL 50 MG/ML IJ SOLN: 12.5000 mg | INTRAMUSCULAR | Status: DC | PRN

## 2012-11-14 MED ORDER — AZITHROMYCIN 250 MG PO TABS: 500.0000 mg | ORAL_TABLET | Freq: Two times a day (BID) | ORAL | Status: DC

## 2012-11-14 MED ORDER — LIDOCAINE HCL (PF) 1 % IJ SOLN
30.0000 mL | INTRAMUSCULAR | Status: DC | PRN
  Filled 2012-11-14: qty 30

## 2012-11-14 MED ORDER — SODIUM CHLORIDE 0.9 % IV SOLN
2.0000 g | Freq: Four times a day (QID) | INTRAVENOUS | Status: DC
  Administered 2012-11-14 (×2): 2 g via INTRAVENOUS
  Filled 2012-11-14 (×5): qty 2000

## 2012-11-14 MED ORDER — PRENATAL MULTIVITAMIN CH: 1.0000 | ORAL_TABLET | Freq: Every day | ORAL | Status: DC

## 2012-11-14 MED ORDER — LIDOCAINE HCL (PF) 1 % IJ SOLN
INTRAMUSCULAR | Status: AC
  Filled 2012-11-14: qty 30

## 2012-11-14 MED ORDER — MAGNESIUM SULFATE 40 G IN LACTATED RINGERS - SIMPLE
2.0000 g/h | INTRAVENOUS | Status: DC
  Administered 2012-11-14: 2 g/h via INTRAVENOUS
  Filled 2012-11-14: qty 500

## 2012-11-14 MED ORDER — LACTATED RINGERS IV SOLN: INTRAVENOUS | Status: DC

## 2012-11-14 MED ORDER — BUTORPHANOL TARTRATE 1 MG/ML IJ SOLN
1.0000 mg | Freq: Once | INTRAMUSCULAR | Status: AC
  Administered 2012-11-14: 1 mg via INTRAVENOUS
  Filled 2012-11-14: qty 1

## 2012-11-14 MED ORDER — CALCIUM CARBONATE ANTACID 500 MG PO CHEW: 2.0000 | CHEWABLE_TABLET | ORAL | Status: DC | PRN

## 2012-11-14 MED ORDER — EPHEDRINE 5 MG/ML INJ
10.0000 mg | INTRAVENOUS | Status: DC | PRN
  Filled 2012-11-14: qty 2

## 2012-11-14 MED ORDER — ZOLPIDEM TARTRATE 5 MG PO TABS: 5.0000 mg | ORAL_TABLET | Freq: Every evening | ORAL | Status: DC | PRN

## 2012-11-14 NOTE — Progress Notes (Signed)
Dr. Eric Form in to discuss plan of care with pt. For infant care.  Pt. States understanding.

## 2012-11-14 NOTE — Anesthesia Procedure Notes (Signed)

## 2012-11-14 NOTE — Progress Notes (Signed)
Patient ID: Natalie Dunn, female   DOB: Nov 17, 1990, 22 y.o.   MRN: [id number] The cervix is no more than 5-6 cm. The fluid appears to be infected so I will start pitocin

## 2012-11-14 NOTE — Progress Notes (Signed)
Dr. Ambrose Mantle in room to remove cerclage from cervix.

## 2012-11-14 NOTE — MAU Provider Note (Signed)
History     CSN: [id number]  Arrival date and time: 11/14/12 1229   None     No chief complaint on file.  HPI Ms. Natalie Dunn is a 22 y.o. G3P0020 at [redacted]w[redacted]d who presents to MAU today with complaint of LOF. The patient states that she was having sharp pains on and off every 10-15 minutes last night. She is still having some that come and go irregularly. She has a cerclage placed in late March. She has been evaluated for ?ROM twice in the last few days. She was diagnosed with BV and told in the office yesterday that there had not been ROM. She denies contractions, N/V or fever. She reports good fetal movement.   OB History   Grav Para Term Preterm Abortions TAB SAB Ect Mult Living   3 0   2  2   0      Past Medical History  Diagnosis Date  . Medical history non-contributory     Past Surgical History  Procedure Laterality Date  . Cervical cerclage N/A 09/19/2012    Procedure: CERCLAGE CERVICAL;  Surgeon: Oliver Pila, MD;  Location: [facility];  Service: Gynecology;  Laterality: N/A;    No family history on file.  History  Substance Use Topics  . Smoking status: Former Games developer  . Smokeless tobacco: Not on file  . Alcohol Use: No    Allergies: No Known Allergies  Prescriptions prior to admission  Medication Sig Dispense Refill  . acetaminophen (TYLENOL) 500 MG tablet Take 1,000 mg by mouth every 6 (six) hours as needed for pain.      . metroNIDAZOLE (FLAGYL) 500 MG tablet Take 1 tablet (500 mg total) by mouth 2 (two) times daily.  14 tablet  0  . Prenatal Vit-Fe Fumarate-FA (PRENATAL MULTIVITAMIN) TABS Take 1 tablet by mouth daily at 12 noon.        Review of Systems  Constitutional: Negative for fever and malaise/fatigue.  Gastrointestinal: Positive for abdominal pain. Negative for nausea, vomiting, diarrhea and constipation.  Genitourinary: Negative for dysuria, urgency and frequency.       Neg - vaginal bleeding + vaginal discharge, LOF   Physical Exam    Blood pressure 141/85, pulse 85, temperature 98.2 F (36.8 C), temperature source Oral, resp. rate 18, last menstrual period 05/03/2012.  Physical Exam  Constitutional: She is oriented to person, place, and time. She appears well-developed and well-nourished. No distress.  HENT:  Head: Normocephalic and atraumatic.  Cardiovascular: Normal rate, regular rhythm and normal heart sounds.   Respiratory: Effort normal and breath sounds normal. No respiratory distress.  GI: Soft. Bowel sounds are normal. She exhibits no distension and no mass. There is tenderness (mild tenderness to palpation of the lower abdomen at midline). There is no rebound and no guarding.  Neurological: She is alert and oriented to person, place, and time.  Skin: Skin is warm and dry. No erythema.  Psychiatric: She has a normal mood and affect.   Results for orders placed during the hospital encounter of 11/14/12 (from the past 24 hour(s))  POCT FERN TEST     Status: None   Collection Time    11/14/12 12:57 PM      Result Value Range   POCT Fern Test       + Crist Fat  MAU Course  Procedures None  MDM Discussed patient with Dr. Ambrose Mantle. Admit to Antenatal. Verbal orders for antibiotics given and placed in Epic. Patient stable for transfer to  Antenatal unit.   Assessment and Plan  A: PPROM  P: Admit to antenatal  Freddi Starr, PA-C  11/14/2012, 1:16 PM

## 2012-11-14 NOTE — Anesthesia Preprocedure Evaluation (Signed)
Anesthesia Evaluation  Patient identified by MRN, date of birth, ID band Patient awake    Reviewed: Allergy & Precautions, H&P , Patient's Chart, lab work & pertinent test results  Airway Mallampati: II TM Distance: >3 FB Neck ROM: full    Dental  (+) Teeth Intact   Pulmonary  breath sounds clear to auscultation        Cardiovascular hypertension, Rhythm:regular Rate:Normal     Neuro/Psych    GI/Hepatic   Endo/Other    Renal/GU      Musculoskeletal   Abdominal   Peds  Hematology   Anesthesia Other Findings Mg+2      Reproductive/Obstetrics (+) Pregnancy                           Anesthesia Physical Anesthesia Plan  ASA: II  Anesthesia Plan: Epidural   Post-op Pain Management:    Induction:   Airway Management Planned:   Additional Equipment:   Intra-op Plan:   Post-operative Plan:   Informed Consent: I have reviewed the patients History and Physical, chart, labs and discussed the procedure including the risks, benefits and alternatives for the proposed anesthesia with the patient or authorized representative who has indicated his/her understanding and acceptance.   Dental Advisory Given  Plan Discussed with:   Anesthesia Plan Comments: (Labs checked- platelets confirmed with RN in room. Fetal heart tracing, per RN, reported to be stable enough for sitting procedure. Discussed epidural, and patient consents to the procedure:  included risk of possible headache,backache, failed block, allergic reaction, and nerve injury. This patient was asked if she had any questions or concerns before the procedure started. )        Anesthesia Quick Evaluation

## 2012-11-14 NOTE — Progress Notes (Signed)
Patient ID: Natalie Dunn, female   DOB: [birth year], 22 y.o.   MRN: [id number] The pt was having regular painful contractions so she was moved from antenatal to L&D She continues to contract painfully and the cervix is FT 90% effaced and the vertex is at 0 station I placed the pt in stirrups and removed the suture. We will give her an epidural assuming she is in labor. I will also order her MgSO4 for neuroprophylaxis.

## 2012-11-14 NOTE — Progress Notes (Signed)
Patient ID: Natalie Dunn, female   DOB: [birth year], 22 y.o.   MRN: [id number] Cervix 8-9 cm 100% effaced and the vertex is at +1 station.

## 2012-11-14 NOTE — MAU Note (Signed)
Patient is in with c/o srom at 1220pm and intermittent lower abdominal pain. She have a cerclage placed in April and dilated 1cm per patient. She reports good fetal movement.

## 2012-11-14 NOTE — H&P (Signed)
NAMEMALENY, Natalie Dunn             ACCOUNT NO.:  [id number]  MEDICAL RECORD NO.:  [id number]  LOCATION:  9151                          FACILITY:  [facility]  PHYSICIAN:  Malachi Pro. Ambrose Mantle, M.D. DATE OF BIRTH:  11-Jan-1991  DATE OF ADMISSION:  11/14/2012 DATE OF DISCHARGE:                             HISTORY & PHYSICAL   PRESENT ILLNESS:  This is a 22 year old black female, para 0-0-2-0, gravida 3, [facility] February 07, 2013, by an ultrasound at 8 weeks done on June 28, 2012, admitted with preterm premature rupture of the membranes.  Blood group and type O positive.  Negative antibody.  Pap smear normal.  Rubella immune.  RPR nonreactive.  Urine culture negative.  Hepatitis B surface antigen negative.  HIV negative.  GC and Chlamydia negative.  Hemoglobin electrophoresis AA.  First trimester screen, AFP and cystic fibrosis screen all declined.  The patient had her 1st prenatal visit in our office at [redacted] weeks gestation and actually had bleeding at [redacted] weeks gestation.She was noted to have a short cervix at the 18 week [place] and underwent cerclage and received betamethasone at 24 weeks.   Last week, she had complaints of possibly some leakage of fluid, had been treated for bacterial vaginosis, and came to the hospital today thinking she was leaking fluid.  She also complains that she has been contracting for over 24 hours, but the contractions are no more severe today than they were. When she was seen in maternity admission unit today, rupture of membranes was confirmed.  She is admitted and placed on antibiotics with ampicillin and azithromycin, and will get her to have a group B strep culture done.  I will consult with [facility] about the advisability of another dose of steroids since she had one 2 weeks ago.  PAST MEDICAL HISTORY:  She has had a history of Chlamydia in 2011.  She had spontaneous abortions in 2011 and 2012.  PAST SURGICAL HISTORY:  She has not had any surgical procedures  done.  ALLERGIES:  She has no known drug allergies, and no latex allergy.  FAMILY HISTORY:  Father has a history of drug addiction and arthritis as well as hypertension.  Mother has type 2 diabetes and hypertension. Paternal grandmother with type 2 diabetes and hypertension.  SOCIAL HISTORY:  The patient occasionally drank alcohol.  She has never used drugs, never smoked tobacco.  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  PHYSICAL EXAMINATION:  VITAL SIGNS:  On admission, temperature 98.2, pulse 85, respirations 18, blood pressure 141/85. HEART:  Normal size and sounds.  No murmurs. LUNGS:  Clear to auscultation. ABDOMEN:  Soft.  The patient has a cerclage procedure in place and I will wait to see what her contractions do before deciding whether to remove the cerclage.  ADMITTING IMPRESSION:  Intrauterine pregnancy, 27 weeks 6 days, preterm premature rupture of the membranes, history of cervical cerclage.     Malachi Pro. Ambrose Mantle, M.D.     TFH/MEDQ  D:  11/14/2012  T:  11/14/2012  Job:  [id number]

## 2012-11-14 NOTE — Progress Notes (Signed)
Patient ID: Natalie Dunn, female   DOB: September 11, 1990, 22 y.o.   MRN: [id number] Delivery note:  The pt reached full dilatation and Dr. Eric Form was called to attend the delivery. After his arrival the pt was asked to push and with 2 or 3 contractions she delivered a living female infant ROA over an intact perineum. There was an occult cord prolapse. There was no shoulder dystocia. The cord was clamped after the nose and pharynx were suctioned with a bulb syringe and the baby was given to Dr. Eric Form who will assign the Apgars. The placenta was removed intact and the uterus was normal. There were no lacerations. I will culture the membranes if we can get a culture tube EBL 200 cc's.

## 2012-11-14 NOTE — Progress Notes (Signed)
Patient ID: Natalie Dunn, female   DOB: [birth year], 22 y.o.   MRN: [id number] Pt is comfortable now with her epidural The cervix is 5 cm 100% effaced and the vertex is at 0 station.

## 2012-11-14 NOTE — Consult Note (Signed)
Asked by Dr. Ambrose Mantle to provide prenatal consultation for patient at risk for preterm delivery due to PROM and preterm labor.  Mother is 21y.o. G3 P0-0-2 (SAb x 2)-0 who is now 19 6/[redacted] weeks EGA, with pregnancy complicated by need for cerclage (?incompetent cervix) and had an episode of preterm labor at 24 wks, at which time she received BMZ x 2.  Now with PROM and malodorous fluid noted when cerclage removed. She is being treated with another course of betamethasone, Mg sulfate neuroprophylaxis, and ampicillin and azithromycin.  Discussed with patient and FOB usual expectations for preterm infant at 67 - [redacted] weeks gestation, including possible needs for DR resuscitation, respiratory support, IV access, and blood products.  Also presented risks of death or serious morbidity, including lung damage.  Projected possible length of stay in NICU until 36 - [redacted] wks EGA.  Discussed advantages of feeding with mother's milk, and encouraged her to consider pumping postnatally.  Patient was uncomfortable during contractions but attentive.  Her mother (baby's maternal GM) was also present and has had previous experience with a relative who had a 2 1/2 pound preemie in our unit., They were appreciative of my input.  Thank you for consulting Neonatology.  Face-to-face time 20 minutes

## 2012-11-15 LAB — CBC
HCT: 34.1 % — ABNORMAL LOW (ref 36.0–46.0)
HCT: 36.9 % (ref 36.0–46.0)
Hemoglobin: 11.5 g/dL — ABNORMAL LOW (ref 12.0–15.0)
MCH: 28.8 pg (ref 26.0–34.0)
MCH: 29.8 pg (ref 26.0–34.0)
MCHC: 33.7 g/dL (ref 30.0–36.0)
MCHC: 34.7 g/dL (ref 30.0–36.0)
MCV: 85.5 fL (ref 78.0–100.0)
MCV: 85.8 fL (ref 78.0–100.0)
Platelets: 246 10*3/uL (ref 150–400)
RDW: 13.1 % (ref 11.5–15.5)

## 2012-11-15 MED ORDER — BENZOCAINE-MENTHOL 20-0.5 % EX AERO: 1.0000 "application " | INHALATION_SPRAY | CUTANEOUS | Status: DC | PRN

## 2012-11-15 MED ORDER — SIMETHICONE 80 MG PO CHEW: 80.0000 mg | CHEWABLE_TABLET | ORAL | Status: DC | PRN

## 2012-11-15 MED ORDER — WITCH HAZEL-GLYCERIN EX PADS: 1.0000 "application " | MEDICATED_PAD | CUTANEOUS | Status: DC | PRN

## 2012-11-15 MED ORDER — LANOLIN HYDROUS EX OINT: TOPICAL_OINTMENT | CUTANEOUS | Status: DC | PRN

## 2012-11-15 MED ORDER — ZOLPIDEM TARTRATE 5 MG PO TABS: 5.0000 mg | ORAL_TABLET | Freq: Every evening | ORAL | Status: DC | PRN

## 2012-11-15 MED ORDER — TETANUS-DIPHTH-ACELL PERTUSSIS 5-2.5-18.5 LF-MCG/0.5 IM SUSP: 0.5000 mL | Freq: Once | INTRAMUSCULAR | Status: DC

## 2012-11-15 MED ORDER — DIBUCAINE 1 % RE OINT: 1.0000 "application " | TOPICAL_OINTMENT | RECTAL | Status: DC | PRN

## 2012-11-15 MED ORDER — ONDANSETRON HCL 4 MG PO TABS: 4.0000 mg | ORAL_TABLET | ORAL | Status: DC | PRN

## 2012-11-15 MED ORDER — OXYTOCIN 40 UNITS IN LACTATED RINGERS INFUSION - SIMPLE MED: 62.5000 mL/h | INTRAVENOUS | Status: AC | PRN

## 2012-11-15 MED ORDER — PRENATAL MULTIVITAMIN CH
1.0000 | ORAL_TABLET | Freq: Every day | ORAL | Status: DC
  Filled 2012-11-15: qty 1

## 2012-11-15 MED ORDER — DIPHENHYDRAMINE HCL 25 MG PO CAPS: 25.0000 mg | ORAL_CAPSULE | Freq: Four times a day (QID) | ORAL | Status: DC | PRN

## 2012-11-15 MED ORDER — OXYCODONE-ACETAMINOPHEN 5-325 MG PO TABS: 1.0000 | ORAL_TABLET | ORAL | Status: DC | PRN

## 2012-11-15 MED ORDER — ONDANSETRON HCL 4 MG/2ML IJ SOLN: 4.0000 mg | INTRAMUSCULAR | Status: DC | PRN

## 2012-11-15 MED ORDER — LACTATED RINGERS IV SOLN: INTRAVENOUS | Status: AC

## 2012-11-15 MED ORDER — MEASLES, MUMPS & RUBELLA VAC ~~LOC~~ INJ
0.5000 mL | INJECTION | Freq: Once | SUBCUTANEOUS | Status: DC
  Filled 2012-11-15: qty 0.5

## 2012-11-15 MED ORDER — IBUPROFEN 600 MG PO TABS
600.0000 mg | ORAL_TABLET | Freq: Four times a day (QID) | ORAL | Status: DC
  Administered 2012-11-15 – 2012-11-16 (×5): 600 mg via ORAL
  Filled 2012-11-15 (×6): qty 1

## 2012-11-15 MED ORDER — SENNOSIDES-DOCUSATE SODIUM 8.6-50 MG PO TABS
2.0000 | ORAL_TABLET | Freq: Every day | ORAL | Status: DC
  Administered 2012-11-15: 2 via ORAL

## 2012-11-15 MED ORDER — PRENATAL MULTIVITAMIN CH
1.0000 | ORAL_TABLET | Freq: Every day | ORAL | Status: DC
  Administered 2012-11-15: 1 via ORAL

## 2012-11-15 NOTE — Progress Notes (Signed)
11/15/12 1300  Clinical Encounter Type  Visited With Patient  Visit Type Initial;Spiritual support;Social support  Referral From Chaplain;Nurse  Recommendations Spiritual Care will follow for support.  Spiritual Encounters  Spiritual Needs Emotional   Natalie Dunn was in great spirits during this visit.  She describes herself as naturally joyful, and indeed I saw her radiate positivity and connection.  Per pt, she is grateful that baby Sheliah Plane is doing so well, given how early he was.  She reports good support, a meaningful emerging relationship with a local church, and very high satisfaction with her care here at [facility].  She is aware of ongoing chaplain availability.  Provided pastoral presence and listening, witness to her story, opportunity for her to process her pregnancy and birth experiences, and encouragement/affirmation.  Will follow in the NICU for further support.  [address] [place], [place] [phone]

## 2012-11-15 NOTE — Progress Notes (Signed)
Patient ID: Natalie Dunn, female   DOB: [birth year], 22 y.o.   MRN: [id number] #1 afebrile BP normal Baby on CPAP

## 2012-11-15 NOTE — Progress Notes (Signed)
Dr. Ambrose Mantle notified of pt's elevated BP's. States to call if pt becomes symptomatic and BP is 140/90. No new orders given

## 2012-11-15 NOTE — Anesthesia Postprocedure Evaluation (Signed)
  Anesthesia Post-op Note  Patient: Natalie Dunn  Procedure(s) Performed: * No procedures listed *  Patient Location: Women's Unit  Anesthesia Type:Epidural  Level of Consciousness: awake  Airway and Oxygen Therapy: Patient Spontanous Breathing  Post-op Pain: none  Post-op Assessment: Patient's Cardiovascular Status Stable, Respiratory Function Stable, Patent Airway, No signs of Nausea or vomiting, Adequate PO intake, Pain level controlled, No headache, No backache, No residual numbness and No residual motor weakness  Post-op Vital Signs: Reviewed and stable  Complications: No apparent anesthesia complications

## 2012-11-16 MED ORDER — IBUPROFEN 600 MG PO TABS: 600.0000 mg | ORAL_TABLET | Freq: Four times a day (QID) | ORAL | Status: DC | PRN

## 2012-11-16 NOTE — Discharge Summary (Signed)
[name], Natalie Dunn             ACCOUNT NO.:  [id number]  MEDICAL RECORD NO.:  [id number]  LOCATION:  9318                          FACILITY:  [facility]  PHYSICIAN:  Malachi Pro. Ambrose Mantle, M.D. DATE OF BIRTH:  09-Jan-1991  DATE OF ADMISSION:  11/14/2012 DATE OF DISCHARGE:                              DISCHARGE SUMMARY   A 22 year old black female, para 0-0-2-0, gravida 3, [facility] February 07, 2013, by an ultrasound at 8 weeks done on June 28, 2012, admitted with preterm premature rupture of the membranes, all labs were normal except she did decline first trimester screen AFP and cystic fibrosis.  She did have bleeding in early pregnancy.  She was noted to have a short cervix at the time of her 18-week ultrasound.  She underwent a cerclage.  She received betamethasone at 24 weeks and was scheduled to receive another dose of betamethasone the day after admission.  After admission, with premature preterm rupture of membranes, she was having regular painful contractions, so she was moved to Labor and Delivery.  Cervix was a fingertip, 90%, vertex at a 0 station.  I placed the patient in stirrups and removed her cerclage.  I ordered magnesium sulfate for neuro prophylaxis.  She received an epidural by 8:26 p.m., had progressed to 5 cm.  I started Pitocin at 9:23 p.m. because the cervix was still 5-6 cm, at night 10:28 p.m. she was 8-9 cm, reached full dilatation.  Dr. Eric Form was called to attend delivery.  After he arrived, the patient was asked to push and with 2 or 3 contraction, she delivered a living female infant, 2 pounds 5 ounces, ROA over an intact perineum.  There was an occult cord prolapse.  There was no shoulder dystocia.  Baby went to the NICU and has done well, never had to be intubated.  The patient did well postpartum.  On the second postpartum day, she is ready for discharge. She did have 1 blood pressure elevation to 127/99, repeat was normal.  I have asked her to take her blood  pressure every day and report consistently elevated blood pressures of more than 140/90, or if she has any extremely heavy bleeding or fever, also report any significant headache or epigastric pain.  Initial hemoglobin 12.8, hematocrit 36.9, white count 17,400, platelet count 246,000.  Followup hemoglobin was 11.5, RPR was nonreactive.  FINAL DIAGNOSES:  Intrauterine pregnancy at 27 weeks and 6 days with preterm premature rupture of the membranes, active labor and delivery, history of short cervix at 18 weeks requiring cerclage.  OPERATIONS:  Removal of cerclage, spontaneous delivery, vertex.  FINAL CONDITION:  Improved.  INSTRUCTIONS:  Include our regular discharge instruction booklet.  The patient is asked to return to the office in 6 weeks for followup examination.  I did send a culture of the placenta to Path.  Gram stain showed no organisms.  Culture is pending.     Malachi Pro. Ambrose Mantle, M.D.    TFH/MEDQ  D:  11/16/2012  T:  11/16/2012  Job:  [id number]

## 2012-11-16 NOTE — Progress Notes (Signed)
Patient ID: Natalie Dunn, female   DOB: February 21, 1991, 22 y.o.   MRN: [id number] #2 afebrile BP this AM 127/99 Repeat was normal Have advised pt to take BP qd and report >140/90 Also, she has no headache or epigastric pain.

## 2012-11-16 NOTE — Progress Notes (Signed)
Discharge instructions given to patient and significant other at bedside.  Medications, activity instructions, follow up appointments, when to call the doctor and community resources discussed.  No questions at this time.  Patient left unit with all personal belongings in stable condition.  Osvaldo Angst, RN--------------

## 2012-11-17 LAB — CULTURE, BETA STREP (GROUP B ONLY)

## 2012-11-18 LAB — TISSUE CULTURE

## 2012-11-19 LAB — ANAEROBIC CULTURE

## 2013-11-10 ENCOUNTER — Emergency Department (HOSPITAL_COMMUNITY)
Admission: EM | Admit: 2013-11-10 | Discharge: 2013-11-10 | Disposition: A | Discharge status: Home / Self Care | Payer: Managed Care, Other (non HMO) | Attending: Emergency Medicine | Admitting: Emergency Medicine

## 2013-11-10 ENCOUNTER — Encounter (HOSPITAL_COMMUNITY): Payer: Self-pay | Admitting: Emergency Medicine

## 2013-11-10 DIAGNOSIS — J029 Acute pharyngitis, unspecified: Secondary | ICD-10-CM

## 2013-11-10 DIAGNOSIS — Z87891 Personal history of nicotine dependence: Secondary | ICD-10-CM | POA: Insufficient documentation

## 2013-11-10 DIAGNOSIS — Z79899 Other long term (current) drug therapy: Secondary | ICD-10-CM | POA: Insufficient documentation

## 2013-11-10 LAB — RAPID STREP SCREEN (MED CTR MEBANE ONLY): Streptococcus, Group A Screen (Direct): NEGATIVE

## 2013-11-10 MED ORDER — ACETAMINOPHEN 325 MG PO TABS
650.0000 mg | ORAL_TABLET | Freq: Once | ORAL | Status: AC
  Administered 2013-11-10: 650 mg via ORAL
  Filled 2013-11-10: qty 2

## 2013-11-10 MED ORDER — CEPHALEXIN 500 MG PO CAPS: 500.0000 mg | ORAL_CAPSULE | Freq: Two times a day (BID) | ORAL | Status: DC

## 2013-11-10 MED ORDER — AZITHROMYCIN 250 MG PO TABS: ORAL_TABLET | ORAL | Status: DC

## 2013-11-10 NOTE — ED Notes (Signed)
Pt from home c/o sore throat since Friday. She's taking alka-seltzer and cough drops with out relief.

## 2013-11-10 NOTE — ED Provider Notes (Signed)
CSN: [id number]     Arrival date & time 11/10/13  1320 History  This chart was scribed for non-physician practitioner, Raymon MuttonMarissa Jelesa Mangini, PA-C working with Suzi RootsKevin E Steinl, MD by Greggory StallionKayla Andersen, ED scribe. This patient was seen in room WTR7/WTR7 and the patient's care was started at 4:19 PM.   Chief Complaint  Patient presents with  . Sore Throat   The history is provided by the patient. No language interpreter was used.   HPI Comments: Natalie DustKiara A Dunn is a 23 y.o. female who presents to the Emergency Department complaining of gradual onset, worsening sore throat that started 2 days ago. Pt has used cough drops with no relief. She has pain with swallowing and eating. She is also having congestion and mild nonproductive cough. Pt has used Catering manageralka seltzer with title relief. Denies fever, chills, diaphoresis, trouble swallowing, ear pain, ear discharge, eye pain, eye watering, chest pain, leg swelling, SOB, difficulty breathing, abdominal pain, nausea, emesis, diarrhea, black tarry stools, hematochezia, neck pain. Denies history of environmental allergies. Pt does not currently take prenatal vitamins or birth control pills. States not breast feeding. She has no allergies to medications.   Past Medical History  Diagnosis Date  . Medical history non-contributory    Past Surgical History  Procedure Laterality Date  . Cervical cerclage N/A 09/19/2012    Procedure: CERCLAGE CERVICAL;  Surgeon: Oliver PilaKathy W Richardson, MD;  Location: [facility];  Service: Gynecology;  Laterality: N/A;   No family history on file. History  Substance Use Topics  . Smoking status: Former Games developermoker  . Smokeless tobacco: Not on file  . Alcohol Use: No   OB History   Grav Para Term Preterm Abortions TAB SAB Ect Mult Living   3 1  1 2  2    0     Review of Systems  Constitutional: Negative for fever, chills and diaphoresis.  HENT: Positive for congestion and sore throat. Negative for ear discharge, ear pain and trouble swallowing.    Eyes: Negative for pain.  Respiratory: Positive for cough. Negative for shortness of breath.   Cardiovascular: Negative for chest pain and leg swelling.  Gastrointestinal: Negative for nausea, vomiting, abdominal pain, diarrhea and blood in stool.  Musculoskeletal: Negative for neck pain.  All other systems reviewed and are negative.  Allergies  Review of patient's allergies indicates no known allergies.  Home Medications   Prior to Admission medications   Medication Sig Start Date End Date Taking? Authorizing Provider  ibuprofen (ADVIL,MOTRIN) 600 MG tablet Take 1 tablet (600 mg total) by mouth every 6 (six) hours as needed for pain. 11/16/12   Bing Plumehomas F Henley, MD  Prenatal Vit-Fe Fumarate-FA (PRENATAL MULTIVITAMIN) TABS Take 1 tablet by mouth daily at 12 noon.    Historical Provider, MD   BP 133/82  Pulse 84  Temp(Src) 98.5 F (36.9 C) (Oral)  Resp 16  SpO2 99%  LMP 10/17/2013  Physical Exam  Nursing note and vitals reviewed. Constitutional: She is oriented to person, place, and time. She appears well-developed and well-nourished. No distress.  HENT:  Head: Normocephalic and atraumatic.  Right Ear: Tympanic membrane and ear canal normal.  Negative facial swelling or signs of cellulitis identified. Mild posterior oropharynx swelling identified. Negative tonsillar adenopathy noted. Negative petechiae or exudate identified. Uvula midline with symmetrical elevation. Negative uvula swelling. Negative trismus.  Left cerumen impaction.   Eyes: Conjunctivae and EOM are normal. Pupils are equal, round, and reactive to light. Right eye exhibits no discharge. Left eye exhibits  no discharge.  Neck: Normal range of motion. Neck supple. No tracheal deviation present.  Negative neck stiffness Negative nuchal rigidity Negative cervical lymphadenopathy  Negative meningeal signs  Cardiovascular: Normal rate, regular rhythm and normal heart sounds.  Exam reveals no friction rub.   No murmur  heard. Pulses:      Radial pulses are 2+ on the right side, and 2+ on the left side.  Pulmonary/Chest: Effort normal and breath sounds normal. No respiratory distress. She has no wheezes. She has no rales.  Patient is able to speak in full sentences without difficulty  Negative use of accessory muscles Negative stridor Negative active drooling Negative muffled voice noted  Musculoskeletal: Normal range of motion.  Full ROM to upper and lower extremities without difficulty noted, negative ataxia noted.  Lymphadenopathy:    She has no cervical adenopathy.  Neurological: She is alert and oriented to person, place, and time. No cranial nerve deficit. She exhibits normal muscle tone. Coordination normal.  Skin: Skin is warm and dry.  Psychiatric: She has a normal mood and affect. Her behavior is normal.    ED Course  Procedures (including critical care time)  DIAGNOSTIC STUDIES: Oxygen Saturation is 98% on RA, normal by my interpretation.    COORDINATION OF CARE: 4:26 PM-Discussed treatment plan which includes an antibiotic with pt at bedside and pt agreed to plan.   Results for orders placed during the hospital encounter of 11/10/13  RAPID STREP SCREEN      Result Value Ref Range   Streptococcus, Group A Screen (Direct) NEGATIVE  NEGATIVE   Labs Review Labs Reviewed  RAPID STREP SCREEN  CULTURE, GROUP A STREP    Imaging Review No results found.   EKG Interpretation None      MDM   Final diagnoses:  Pharyngitis   Medications  acetaminophen (TYLENOL) tablet 650 mg (650 mg Oral Given 11/10/13 1634)    Filed Vitals:   11/10/13 1338 11/10/13 1632  BP: 133/82   Pulse: 96 84  Temp: 100.1 F (37.8 C) 98.5 F (36.9 C)  TempSrc: Oral Oral  Resp: 16   SpO2: 98% 99%   I personally performed the services described in this documentation, which was scribed in my presence. The recorded information has been reviewed and is accurate.  Rapid strep negative. Mild swelling  identified to the posterior oropharynx with negative exudate, petechiae identified. Negative tonsillar adenopathy noted. Uvula midline with symmetrical elevation-negative uvula swelling. Patient had mild low-grade fever while in ED setting 100.35F, Tylenol administered-patient responded well to the Tylenol.  Doubt peritonsillar abscess. Doubt retropharyngeal abscess. Doubt tonsillitis. Doubt streptococcal pharyngitis. Suspicion to be pharyngitis. Patient stable, afebrile. Patient not septic appearing. Discharged patient. Discharged patient with antibiotics for coverage. Discussed with patient to rest and stay hydrated. Discussed with patient to avoid any physical strenuous activity. Referred patient to PCP. Discussed with patient to closely monitor symptoms and if symptoms are to worsen or change to report back to the ED - strict return instructions given.  Patient agreed to plan of care, understood, all questions answered.   Raymon MuttonMarissa Serina Nichter, PA-C 11/10/13 2133

## 2013-11-10 NOTE — Discharge Instructions (Signed)
Please call your doctor for a followup appointment within 24-48 hours. When you talk to your doctor please let them know that you were seen in the emergency department and have them acquire all of your records so that they can discuss the findings with you and formulate a treatment plan to fully care for your new and ongoing problems. Please call and set-up an appointment with your primary care provider to be re-assessed within 24-48 hours Please rest and stay hydrated - please drink plenty of water Please avoid any physical or strenuous activity Please take medications as prescribed While on medications please do not breast feed Please continue to monitor symptoms and if symptoms are to worsen or change (fever greater than 101, chills, sweating, chest pain, shortness of breath, difficulty breathing, neck pain, throat closing sensation, jaw pain, difficulty swallowing, swelling to the face, rash, worsening or changes to throat pain) please report back to the ED immediately  Pharyngitis Pharyngitis is redness, pain, and swelling (inflammation) of your pharynx.  CAUSES  Pharyngitis is usually caused by infection. Most of the time, these infections are from viruses (viral) and are part of a cold. However, sometimes pharyngitis is caused by bacteria (bacterial). Pharyngitis can also be caused by allergies. Viral pharyngitis may be spread from person to person by coughing, sneezing, and personal items or utensils (cups, forks, spoons, toothbrushes). Bacterial pharyngitis may be spread from person to person by more intimate contact, such as kissing.  SIGNS AND SYMPTOMS  Symptoms of pharyngitis include:   Sore throat.   Tiredness (fatigue).   Low-grade fever.   Headache.  Joint pain and muscle aches.  Skin rashes.  Swollen lymph nodes.  Plaque-like film on throat or tonsils (often seen with bacterial pharyngitis). DIAGNOSIS  Your health care provider will ask you questions about your  illness and your symptoms. Your medical history, along with a physical exam, is often all that is needed to diagnose pharyngitis. Sometimes, a rapid strep test is done. Other lab tests may also be done, depending on the suspected cause.  TREATMENT  Viral pharyngitis will usually get better in 3 4 days without the use of medicine. Bacterial pharyngitis is treated with medicines that kill germs (antibiotics).  HOME CARE INSTRUCTIONS   Drink enough water and fluids to keep your urine clear or pale yellow.   Only take over-the-counter or prescription medicines as directed by your health care provider:   If you are prescribed antibiotics, make sure you finish them even if you start to feel better.   Do not take aspirin.   Get lots of rest.   Gargle with 8 oz of salt water ( tsp of salt per 1 qt of water) as often as every 1 2 hours to soothe your throat.   Throat lozenges (if you are not at risk for choking) or sprays may be used to soothe your throat. SEEK MEDICAL CARE IF:   You have large, tender lumps in your neck.  You have a rash.  You cough up green, yellow-[place], or bloody spit. SEEK IMMEDIATE MEDICAL CARE IF:   Your neck becomes stiff.  You drool or are unable to swallow liquids.  You vomit or are unable to keep medicines or liquids down.  You have severe pain that does not go away with the use of recommended medicines.  You have trouble breathing (not caused by a stuffy nose). MAKE SURE YOU:   Understand these instructions.  Will watch your condition.  Will get help right  away if you are not doing well or get worse. Document Released: 06/27/2005 Document Revised: 04/17/2013 Document Reviewed: 03/04/2013 [facility] Patient Information 2014 [employer] [place].

## 2013-11-12 LAB — CULTURE, GROUP A STREP

## 2013-11-12 NOTE — ED Provider Notes (Signed)
Medical screening examination/treatment/procedure(s) were performed by non-physician practitioner and as supervising physician I was immediately available for consultation/collaboration.   EKG Interpretation None        Jayjay Littles E Elisavet Buehrer, MD 11/12/13 1420 

## 2014-05-12 ENCOUNTER — Encounter (HOSPITAL_COMMUNITY): Payer: Self-pay | Admitting: Emergency Medicine

## 2014-12-09 ENCOUNTER — Encounter (HOSPITAL_COMMUNITY): Payer: Self-pay | Admitting: *Deleted

## 2014-12-09 ENCOUNTER — Inpatient Hospital Stay (HOSPITAL_COMMUNITY): Payer: Managed Care, Other (non HMO)

## 2014-12-09 ENCOUNTER — Inpatient Hospital Stay (HOSPITAL_COMMUNITY)
Admission: AD | Admit: 2014-12-09 | Discharge: 2014-12-09 | Disposition: A | Discharge status: Home / Self Care | Payer: Managed Care, Other (non HMO) | Source: Ambulatory Visit | Attending: Family Medicine | Admitting: Family Medicine

## 2014-12-09 DIAGNOSIS — N760 Acute vaginitis: Secondary | ICD-10-CM | POA: Insufficient documentation

## 2014-12-09 DIAGNOSIS — Z3A01 Less than 8 weeks gestation of pregnancy: Secondary | ICD-10-CM | POA: Insufficient documentation

## 2014-12-09 DIAGNOSIS — O9989 Other specified diseases and conditions complicating pregnancy, childbirth and the puerperium: Secondary | ICD-10-CM

## 2014-12-09 DIAGNOSIS — R109 Unspecified abdominal pain: Secondary | ICD-10-CM

## 2014-12-09 DIAGNOSIS — O23591 Infection of other part of genital tract in pregnancy, first trimester: Secondary | ICD-10-CM

## 2014-12-09 DIAGNOSIS — B9689 Other specified bacterial agents as the cause of diseases classified elsewhere: Secondary | ICD-10-CM | POA: Insufficient documentation

## 2014-12-09 DIAGNOSIS — Z87891 Personal history of nicotine dependence: Secondary | ICD-10-CM | POA: Diagnosis not present

## 2014-12-09 DIAGNOSIS — O26899 Other specified pregnancy related conditions, unspecified trimester: Secondary | ICD-10-CM

## 2014-12-09 DIAGNOSIS — O3680X0 Pregnancy with inconclusive fetal viability, not applicable or unspecified: Secondary | ICD-10-CM

## 2014-12-09 LAB — CBC WITH DIFFERENTIAL/PLATELET
Basophils Absolute: 0 10*3/uL (ref 0.0–0.1)
Basophils Relative: 0 % (ref 0–1)
EOS PCT: 1 % (ref 0–5)
Eosinophils Absolute: 0.1 10*3/uL (ref 0.0–0.7)
HCT: 39.2 % (ref 36.0–46.0)
HEMOGLOBIN: 13.1 g/dL (ref 12.0–15.0)
LYMPHS ABS: 2.3 10*3/uL (ref 0.7–4.0)
Lymphocytes Relative: 25 % (ref 12–46)
MCH: 28.9 pg (ref 26.0–34.0)
MCHC: 33.4 g/dL (ref 30.0–36.0)
MCV: 86.5 fL (ref 78.0–100.0)
MONO ABS: 0.6 10*3/uL (ref 0.1–1.0)
Monocytes Relative: 6 % (ref 3–12)
NEUTROS PCT: 68 % (ref 43–77)
Neutro Abs: 6.2 10*3/uL (ref 1.7–7.7)
Platelets: 281 10*3/uL (ref 150–400)
RBC: 4.53 MIL/uL (ref 3.87–5.11)
RDW: 12.9 % (ref 11.5–15.5)
WBC: 9.1 10*3/uL (ref 4.0–10.5)

## 2014-12-09 LAB — URINALYSIS, ROUTINE W REFLEX MICROSCOPIC
BILIRUBIN URINE: NEGATIVE
Glucose, UA: NEGATIVE mg/dL
Hgb urine dipstick: NEGATIVE
Ketones, ur: NEGATIVE mg/dL
Leukocytes, UA: NEGATIVE
NITRITE: NEGATIVE
Protein, ur: NEGATIVE mg/dL
SPECIFIC GRAVITY, URINE: 1.01 (ref 1.005–1.030)
UROBILINOGEN UA: 0.2 mg/dL (ref 0.0–1.0)
pH: 5.5 (ref 5.0–8.0)

## 2014-12-09 LAB — POCT PREGNANCY, URINE: Preg Test, Ur: POSITIVE — AB

## 2014-12-09 LAB — WET PREP, GENITAL
Trich, Wet Prep: NONE SEEN
Yeast Wet Prep HPF POC: NONE SEEN

## 2014-12-09 LAB — HCG, QUANTITATIVE, PREGNANCY: hCG, Beta Chain, Quant, S: 157 m[IU]/mL — ABNORMAL HIGH (ref <5)

## 2014-12-09 MED ORDER — METRONIDAZOLE 500 MG PO TABS: 500.0000 mg | ORAL_TABLET | Freq: Two times a day (BID) | ORAL | Status: DC

## 2014-12-09 NOTE — Discharge Instructions (Signed)
First Trimester of Pregnancy The first trimester of pregnancy is from week 1 until the end of week 12 (months 1 through 3). During this time, your baby will begin to develop inside you. At 6-8 weeks, the eyes and face are formed, and the heartbeat can be seen on ultrasound. At the end of 12 weeks, all the baby's organs are formed. Prenatal care is all the medical care you receive before the birth of your baby. Make sure you get good prenatal care and follow all of your doctor's instructions. HOME CARE  Medicines  Take medicine only as told by your doctor. Some medicines are safe and some are not during pregnancy.  Take your prenatal vitamins as told by your doctor.  Take medicine that helps you poop (stool softener) as needed if your doctor says it is okay. Diet  Eat regular, healthy meals.  Your doctor will tell you the amount of weight gain that is right for you.  Avoid raw meat and uncooked cheese.  If you feel sick to your stomach (nauseous) or throw up (vomit):  Eat 4 or 5 small meals a day instead of 3 large meals.  Try eating a few soda crackers.  Drink liquids between meals instead of during meals.  If you have a hard time pooping (constipation):  Eat high-fiber foods like fresh vegetables, fruit, and whole grains.  Drink enough fluids to keep your pee (urine) clear or pale yellow. Activity and Exercise  Exercise only as told by your doctor. Stop exercising if you have cramps or pain in your lower belly (abdomen) or low back.  Try to avoid standing for long periods of time. Move your legs often if you must stand in one place for a long time.  Avoid heavy lifting.  Wear low-heeled shoes. Sit and stand up straight.  You can have sex unless your doctor tells you not to. Relief of Pain or Discomfort  Wear a good support bra if your breasts are sore.  Take warm water baths (sitz baths) to soothe pain or discomfort caused by hemorrhoids. Use hemorrhoid cream if your  doctor says it is okay.  Rest with your legs raised if you have leg cramps or low back pain.  Wear support hose if you have puffy, bulging veins (varicose veins) in your legs. Raise (elevate) your feet for 15 minutes, 3-4 times a day. Limit salt in your diet. Prenatal Care  Schedule your prenatal visits by the twelfth week of pregnancy.  Write down your questions. Take them to your prenatal visits.  Keep all your prenatal visits as told by your doctor. Safety  Wear your seat belt at all times when driving.  Make a list of emergency phone numbers. The list should include numbers for family, friends, the hospital, and police and fire departments. General Tips  Ask your doctor for a referral to a local prenatal class. Begin classes no later than at the start of month 6 of your pregnancy.  Ask for help if you need counseling or help with nutrition. Your doctor can give you advice or tell you where to go for help.  Do not use hot tubs, steam rooms, or saunas.  Do not douche or use tampons or scented sanitary pads.  Do not cross your legs for long periods of time.  Avoid litter boxes and soil used by cats.  Avoid all smoking, herbs, and alcohol. Avoid drugs not approved by your doctor.  Visit your dentist. At home, brush your teeth   with a soft toothbrush. Be gentle when you floss. GET HELP IF:  You are dizzy.  You have mild cramps or pressure in your lower belly.  You have a nagging pain in your belly area.  You continue to feel sick to your stomach, throw up, or have watery poop (diarrhea).  You have a bad smelling fluid coming from your vagina.  You have pain with peeing (urination).  You have increased puffiness (swelling) in your face, hands, legs, or ankles. GET HELP RIGHT AWAY IF:   You have a fever.  You are leaking fluid from your vagina.  You have spotting or bleeding from your vagina.  You have very bad belly cramping or pain.  You gain or lose weight  rapidly.  You throw up blood. It may look like coffee grounds.  You are around people who have German measles, fifth disease, or chickenpox.  You have a very bad headache.  You have shortness of breath.  You have any kind of trauma, such as from a fall or a car accident. Document Released: 12/14/2007 Document Revised: 11/11/2013 Document Reviewed: 05/07/2013 [employer] Patient Information 2015 [employer]. This information is not intended to replace advice given to you by your health care provider. Make sure you discuss any questions you have with your health care provider.  

## 2014-12-09 NOTE — MAU Provider Note (Signed)
History     CSN: [id number]  Arrival date and time: 12/09/14 1759   First Provider Initiated Contact with Patient 12/09/14 [id number]      Chief Complaint  Patient presents with  . Abdominal Pain   HPI Ms. Natalie Dunn is a 24 y.o. [phone] at 3880w5d who presents to MAU today with complaint of lower abdominal cramping x 2 days. The patient denies vaginal bleeding, discharge, UTI symptoms, N/V/D or fever. She states pain is rated at 5/10 now. She has not taken anything for pain. LMP 11/13/14 and also noted some spotting x 1 day since that cycle, but none today.   OB History    Gravida Para Term Preterm AB TAB SAB Ectopic Multiple Living   4 1  1 2  2    0      Past Medical History  Diagnosis Date  . Medical history non-contributory     Past Surgical History  Procedure Laterality Date  . Cervical cerclage N/A 09/19/2012    Procedure: CERCLAGE CERVICAL;  Surgeon: Oliver PilaKathy W Richardson, MD;  Location: [facility];  Service: Gynecology;  Laterality: N/A;    History reviewed. No pertinent family history.  History  Substance Use Topics  . Smoking status: Former Games developermoker  . Smokeless tobacco: Not on file  . Alcohol Use: No    Allergies: No Known Allergies  No prescriptions prior to admission    Review of Systems  Constitutional: Negative for fever and malaise/fatigue.  Gastrointestinal: Positive for abdominal pain. Negative for nausea, vomiting, diarrhea and constipation.  Genitourinary: Negative for dysuria, urgency and frequency.       Neg - vaginal bleeding, discharge   Physical Exam   Blood pressure 131/92, pulse 79, temperature 98.7 F (37.1 C), temperature source Oral, resp. rate 18, height 5\' 5"  (1.651 m), weight 229 lb (103.874 kg), last menstrual period 11/13/2014, unknown if currently breastfeeding.  Physical Exam  Nursing note and vitals reviewed. Constitutional: She is oriented to person, place, and time. She appears well-developed and well-nourished. No distress.   HENT:  Head: Normocephalic and atraumatic.  Cardiovascular: Normal rate.   Respiratory: Effort normal.  GI: Soft. She exhibits no distension and no mass. There is no tenderness. There is no rebound and no guarding.  Genitourinary: Uterus is not enlarged and not tender. Cervix exhibits no motion tenderness, no discharge and no friability. Right adnexum displays no mass and no tenderness. Left adnexum displays no mass and no tenderness. No bleeding in the vagina. No vaginal discharge found.  Neurological: She is alert and oriented to person, place, and time.  Skin: Skin is warm and dry. No erythema.  Psychiatric: She has a normal mood and affect.   Results for orders placed or performed during the hospital encounter of 12/09/14 (from the past 24 hour(s))  Urinalysis, Routine w reflex microscopic ([facility])     Status: None   Collection Time: 12/09/14  6:35 PM  Result Value Ref Range   Color, Urine YELLOW YELLOW   APPearance CLEAR CLEAR   Specific Gravity, Urine 1.010 1.005 - 1.030   pH 5.5 5.0 - 8.0   Glucose, UA NEGATIVE NEGATIVE mg/dL   Hgb urine dipstick NEGATIVE NEGATIVE   Bilirubin Urine NEGATIVE NEGATIVE   Ketones, ur NEGATIVE NEGATIVE mg/dL   Protein, ur NEGATIVE NEGATIVE mg/dL   Urobilinogen, UA 0.2 0.0 - 1.0 mg/dL   Nitrite NEGATIVE NEGATIVE   Leukocytes, UA NEGATIVE NEGATIVE  Pregnancy, urine POC     Status: Abnormal  Collection Time: 12/09/14  6:45 PM  Result Value Ref Range   Preg Test, Ur POSITIVE (A) NEGATIVE  CBC with Differential/Platelet     Status: None   Collection Time: 12/09/14  8:40 PM  Result Value Ref Range   WBC 9.1 4.0 - 10.5 K/uL   RBC 4.53 3.87 - 5.11 MIL/uL   Hemoglobin 13.1 12.0 - 15.0 g/dL   HCT 16.1 09.6 - 04.5 %   MCV 86.5 78.0 - 100.0 fL   MCH 28.9 26.0 - 34.0 pg   MCHC 33.4 30.0 - 36.0 g/dL   RDW 40.9 81.1 - 91.4 %   Platelets 281 150 - 400 K/uL   Neutrophils Relative % 68 43 - 77 %   Neutro Abs 6.2 1.7 - 7.7 K/uL   Lymphocytes  Relative 25 12 - 46 %   Lymphs Abs 2.3 0.7 - 4.0 K/uL   Monocytes Relative 6 3 - 12 %   Monocytes Absolute 0.6 0.1 - 1.0 K/uL   Eosinophils Relative 1 0 - 5 %   Eosinophils Absolute 0.1 0.0 - 0.7 K/uL   Basophils Relative 0 0 - 1 %   Basophils Absolute 0.0 0.0 - 0.1 K/uL  hCG, quantitative, pregnancy     Status: Abnormal   Collection Time: 12/09/14  8:40 PM  Result Value Ref Range   hCG, Beta Chain, Quant, S 157 (H) <5 mIU/mL  Wet prep, genital     Status: Abnormal   Collection Time: 12/09/14  9:55 PM  Result Value Ref Range   Yeast Wet Prep HPF POC NONE SEEN NONE SEEN   Trich, Wet Prep NONE SEEN NONE SEEN   Clue Cells Wet Prep HPF POC FEW (A) NONE SEEN   WBC, Wet Prep HPF POC FEW (A) NONE SEEN   US Ob Comp Less 14 Wks  12/09/2014   CLINICAL DATA:  Acute onset of pelvic cramping and abdominal pain. Initial encounter.  EXAM: OBSTETRIC <14 WK [place] AND TRANSVAGINAL OB US  TECHNIQUE: Both transabdominal and transvaginal ultrasound examinations were performed for complete evaluation of the gestation as well as the maternal uterus, adnexal regions, and pelvic cul-de-sac. Transvaginal technique was performed to assess early pregnancy.  COMPARISON:  Pelvic ultrasound performed 11/14/2012  FINDINGS: Intrauterine gestational sac: None seen.  Yolk sac:  N/A  Embryo:  N/A  Maternal uterus/adnexae: The uterus is unremarkable in appearance.  The ovaries are within normal limits. The right ovary measures 4.1 x 2.1 x 2.8 cm, while the left ovary measures 2.5 x 2.4 x 1.9 cm. No suspicious adnexal masses are seen; there is no evidence for ovarian torsion.  No free fluid is seen within the pelvic cul-de-sac.  IMPRESSION: No intrauterine gestational sac seen. No evidence for ectopic pregnancy at this time. The patient's quantitative beta HCG level is not yet available for comparison. If it continues to trend upward, follow-up pelvic ultrasound could be performed in 2 weeks for further evaluation.   Electronically  Signed   By: Roanna Raider M.D.   On: 12/09/2014 21:32   US Ob Transvaginal  12/09/2014   CLINICAL DATA:  Acute onset of pelvic cramping and abdominal pain. Initial encounter.  EXAM: OBSTETRIC <14 WK [place] AND TRANSVAGINAL OB US  TECHNIQUE: Both transabdominal and transvaginal ultrasound examinations were performed for complete evaluation of the gestation as well as the maternal uterus, adnexal regions, and pelvic cul-de-sac. Transvaginal technique was performed to assess early pregnancy.  COMPARISON:  Pelvic ultrasound performed 11/14/2012  FINDINGS: Intrauterine gestational sac:  None seen.  Yolk sac:  N/A  Embryo:  N/A  Maternal uterus/adnexae: The uterus is unremarkable in appearance.  The ovaries are within normal limits. The right ovary measures 4.1 x 2.1 x 2.8 cm, while the left ovary measures 2.5 x 2.4 x 1.9 cm. No suspicious adnexal masses are seen; there is no evidence for ovarian torsion.  No free fluid is seen within the pelvic cul-de-sac.  IMPRESSION: No intrauterine gestational sac seen. No evidence for ectopic pregnancy at this time. The patient's quantitative beta HCG level is not yet available for comparison. If it continues to trend upward, follow-up pelvic ultrasound could be performed in 2 weeks for further evaluation.   Electronically Signed   By: Roanna Raider M.D.   On: 12/09/2014 21:32    MAU Course  Procedures None  MDM +UPT UA, wet prep, GC/chlamydia, CBC, ABO/Rh, quant hCG, HIV, RPR and [place] today to rule out ectopic pregnancy  Assessment and Plan  A: Abdominal pain in pregnancy, first trimester Pregnancy of unknown location Bacterial vaginosis  P: Discharge home Rx for Flagyl given to patient Tylenol PRN for pain Ectopic/bleeding precautions discussed Patient advised to follow-up in MAU 12/11/14 in the evening for repeat quant hCG or sooner PRN   Marny Lowenstein, PA-C  12/09/2014, 10:58 PM

## 2014-12-09 NOTE — MAU Note (Signed)
Cramping in lower abd past couple days.  No bleeding.  Has not done test or been seen anywhere.

## 2014-12-09 NOTE — MAU Note (Signed)
Period in May spotted a couple days, then was clear, regular cycle on the 5th

## 2014-12-10 LAB — GC/CHLAMYDIA PROBE AMP (~~LOC~~) NOT AT ARMC
CHLAMYDIA, DNA PROBE: NEGATIVE
NEISSERIA GONORRHEA: NEGATIVE

## 2014-12-10 LAB — HIV ANTIBODY (ROUTINE TESTING W REFLEX): HIV Screen 4th Generation wRfx: NONREACTIVE

## 2014-12-10 LAB — RPR: RPR: NONREACTIVE

## 2015-07-12 NOTE — L&D Delivery Note (Signed)
Delivery Note After pushing with 3 contractions, pt delivered.  At 4:23 AM a viable and healthy female was delivered via Vaginal, Spontaneous Delivery (Presentation:OA ; ROT ).  APGAR: 8, 9; weight 4 lb 5.1 oz (1960 g).   Placenta status: delivered, intact .  Cord: 3V  with the following complications: none.   Anesthesia:  IV pain meds, nitrous Episiotomy: None Lacerations: Periurethral Suture Repair: 3.0 vicryl rapide Est. Blood Loss (mL): 200cc  Mom to postpartum.  Baby to NICU.  Bovard-Stuckert, Tayley Mudrick 06/26/2016, 4:57 AM  D/W pt and FOB r/b/a of circumcision, wish to proceed when cleared by nursery has to make payments  O+/Br (pump)/Did not receive Tdap in [facility]/RI/ Contra ?

## 2015-10-14 ENCOUNTER — Encounter (HOSPITAL_COMMUNITY): Payer: Self-pay | Admitting: *Deleted

## 2015-12-08 ENCOUNTER — Encounter (HOSPITAL_COMMUNITY): Payer: Self-pay | Admitting: Emergency Medicine

## 2015-12-08 ENCOUNTER — Emergency Department (HOSPITAL_COMMUNITY)
Admission: EM | Admit: 2015-12-08 | Discharge: 2015-12-08 | Disposition: A | Discharge status: Home / Self Care | Payer: Managed Care, Other (non HMO) | Attending: Emergency Medicine | Admitting: Emergency Medicine

## 2015-12-08 DIAGNOSIS — Y929 Unspecified place or not applicable: Secondary | ICD-10-CM | POA: Diagnosis not present

## 2015-12-08 DIAGNOSIS — Y999 Unspecified external cause status: Secondary | ICD-10-CM | POA: Diagnosis not present

## 2015-12-08 DIAGNOSIS — S39012A Strain of muscle, fascia and tendon of lower back, initial encounter: Secondary | ICD-10-CM | POA: Diagnosis not present

## 2015-12-08 DIAGNOSIS — E669 Obesity, unspecified: Secondary | ICD-10-CM | POA: Insufficient documentation

## 2015-12-08 DIAGNOSIS — X58XXXA Exposure to other specified factors, initial encounter: Secondary | ICD-10-CM | POA: Insufficient documentation

## 2015-12-08 DIAGNOSIS — Y939 Activity, unspecified: Secondary | ICD-10-CM | POA: Insufficient documentation

## 2015-12-08 DIAGNOSIS — X509XXA Other and unspecified overexertion or strenuous movements or postures, initial encounter: Secondary | ICD-10-CM | POA: Diagnosis not present

## 2015-12-08 DIAGNOSIS — S3992XA Unspecified injury of lower back, initial encounter: Secondary | ICD-10-CM | POA: Diagnosis present

## 2015-12-08 DIAGNOSIS — Z87891 Personal history of nicotine dependence: Secondary | ICD-10-CM | POA: Diagnosis not present

## 2015-12-08 LAB — POC URINE PREG, ED: Preg Test, Ur: POSITIVE — AB

## 2015-12-08 MED ORDER — ACETAMINOPHEN 325 MG PO TABS: 650.0000 mg | ORAL_TABLET | Freq: Once | ORAL | Status: DC

## 2015-12-08 MED ORDER — ACETAMINOPHEN 325 MG PO TABS
650.0000 mg | ORAL_TABLET | Freq: Once | ORAL | Status: AC
  Administered 2015-12-08: 650 mg via ORAL
  Filled 2015-12-08: qty 2

## 2015-12-08 NOTE — ED Provider Notes (Signed)
History  By signing my name below, I, Earmon PhoenixJennifer Waddell, attest that this documentation has been prepared under the direction and in the presence of AvayaSamantha Novice Vrba, PA-C. Electronically Signed: Earmon PhoenixJennifer Waddell, ED Scribe. 12/08/2015. 1:09 PM  Chief Complaint  Patient presents with  . Back Pain   The history is provided by the patient and medical records. No language interpreter was used.    HPI Comments:  Natalie Dunn is a 25 y.o. obese female who presents to the Emergency Department complaining of sharp lower back pain that began about 2.5 hours ago. She states the pain radiates down to her lower buttocks. She states she went from a standing to a sitting position at work when the pain began. She now describes the pain as throbbing. Walking and standing increase the pain. She denies alleviating factors. She has not taken anything for pain. She denies bowel or bladder incontinence, numbness, tingling or weakness of the lower extremities, nausea, vomiting, fever, chills. She denies any trauma, injury or fall. She states her LMP was 10/30/15 and reports two positive home pregnancy tests. She states she has an appt made with her PCP for follow up in one week, 11/14/15. No vaginal bleeding, dysuria or abdominal pain.  Past Medical History  Diagnosis Date  . Medical history non-contributory    Past Surgical History  Procedure Laterality Date  . Cervical cerclage N/A 09/19/2012    Procedure: CERCLAGE CERVICAL;  Surgeon: Oliver PilaKathy W Richardson, MD;  Location: [facility];  Service: Gynecology;  Laterality: N/A;   No family history on file. Social History  Substance Use Topics  . Smoking status: Former Games developermoker  . Smokeless tobacco: Not on file  . Alcohol Use: No   OB History    Gravida Para Term Preterm AB TAB SAB Ectopic Multiple Living   4 1  1 2  2    0     Review of Systems A complete 10 system review of systems was obtained and all systems are negative except as noted in the HPI and PMH.    Allergies  Review of patient's allergies indicates no known allergies.  Home Medications   Prior to Admission medications   Medication Sig Start Date End Date Taking? Authorizing Provider  metroNIDAZOLE (FLAGYL) 500 MG tablet Take 1 tablet (500 mg total) by mouth 2 (two) times daily. 12/09/14   Marny LowensteinJulie N Wenzel, PA-C   Triage Vitals: BP 119/90 mmHg  Pulse 73  Resp 18  SpO2 100% Physical Exam  Constitutional: She is oriented to person, place, and time. She appears well-developed and well-nourished. No distress.  HENT:  Head: Normocephalic and atraumatic.  Eyes: Conjunctivae are normal. Right eye exhibits no discharge. Left eye exhibits no discharge. No scleral icterus.  Cardiovascular: Normal rate.   Pulmonary/Chest: Effort normal.  Musculoskeletal: She exhibits tenderness.  Left lumbar paraspinal muscle tenderness. No midline spinal tenderness. No step off or deformities. Negative SLR.   Neurological: She is alert and oriented to person, place, and time. Coordination normal.  Strength 5/5 throughout. No sensory deficits.  Skin: Skin is warm and dry. No rash noted. She is not diaphoretic. No erythema. No pallor.  Psychiatric: She has a normal mood and affect. Her behavior is normal.  Nursing note and vitals reviewed.   ED Course  Procedures (including critical care time) DIAGNOSTIC STUDIES: Oxygen Saturation is 100% on RA, normal by my interpretation.   COORDINATION OF CARE: 12:26 PM- Will check urine pregnancy test prior to prescribing medications. Pt verbalizes understanding and  agrees to plan.  1:03 PM- Informed pt of positive pregnancy test and advised pt to take OTC Tylenol and ice area. Encouraged pt to keep appt that she has made with PCP in one week.   Medications  acetaminophen (TYLENOL) tablet 650 mg (650 mg Oral Given 12/08/15 1254)    Labs Review Labs Reviewed  POC URINE PREG, ED - Abnormal; Notable for the following:    Preg Test, Ur POSITIVE (*)    All  other components within normal limits    Imaging Review No results found. I have personally reviewed and evaluated these images and lab results as part of my medical decision-making.   EKG Interpretation None      MDM   Final diagnoses:  Lumbar strain, initial encounter    Patient with back pain, worse with movement. No neurological deficits and normal neuro exam.  Patient is ambulatory.  No loss of bowel or bladder control.  No concern for cauda equina.  No fever, night sweats, weight loss, h/o cancer, IVDA, no recent procedure to back. Pt had positive urine preg. Unknown gestational age, LMP 10/30/15. No vaginal bleeding, abdominal pain or dysuria. No urinary symptoms suggestive of UTI. Feel that pts back pain is MSK in etiology. Recommend tylenol and RICE precautions. Pt unable to take muscle relaxers due to pregnancy. Pt has scheduled f/u with OBGYN in 1 week. Supportive care and return precaution discussed. Appears safe for discharge at this time. Follow up as indicated in discharge paperwork.    I personally performed the services described in this documentation, which was scribed in my presence. The recorded information has been reviewed and is accurate.    Lester Kinsman [place], PA-C 12/08/15 1546  Mancel Bale, MD 12/08/15 [phone]

## 2015-12-08 NOTE — ED Notes (Signed)
Per pt, states she was at work and bent over to pick up file-states after she sat down she started having extreme lower back pain

## 2015-12-08 NOTE — Discharge Instructions (Signed)
Muscle Strain A muscle strain is an injury that occurs when a muscle is stretched beyond its normal length. Usually a small number of muscle fibers are torn when this happens. Muscle strain is rated in degrees. First-degree strains have the least amount of muscle fiber tearing and pain. Second-degree and third-degree strains have increasingly more tearing and pain.  Usually, recovery from muscle strain takes 1-2 weeks. Complete healing takes 5-6 weeks.  CAUSES  Muscle strain happens when a sudden, violent force placed on a muscle stretches it too far. This may occur with lifting, sports, or a fall.  RISK FACTORS Muscle strain is especially common in athletes.  SIGNS AND SYMPTOMS At the site of the muscle strain, there may be:  Pain.  Bruising.  Swelling.  Difficulty using the muscle due to pain or lack of normal function. DIAGNOSIS  Your health care provider will perform a physical exam and ask about your medical history. TREATMENT  Often, the best treatment for a muscle strain is resting, icing, and applying cold compresses to the injured area.  HOME CARE INSTRUCTIONS   Use the PRICE method of treatment to promote muscle healing during the first 2-3 days after your injury. The PRICE method involves:  Protecting the muscle from being injured again.  Restricting your activity and resting the injured body part.  Icing your injury. To do this, put ice in a plastic bag. Place a towel between your skin and the bag. Then, apply the ice and leave it on from 15-20 minutes each hour. After the third day, switch to moist heat packs.  Apply compression to the injured area with a splint or elastic bandage. Be careful not to wrap it too tightly. This may interfere with blood circulation or increase swelling.  Elevate the injured body part above the level of your heart as often as you can.  Only take over-the-counter or prescription medicines for pain, discomfort, or fever as directed by your  health care provider.  Warming up prior to exercise helps to prevent future muscle strains. SEEK MEDICAL CARE IF:   You have increasing pain or swelling in the injured area.  You have numbness, tingling, or a significant loss of strength in the injured area. MAKE SURE YOU:   Understand these instructions.  Will watch your condition.  Will get help right away if you are not doing well or get worse.   This information is not intended to replace advice given to you by your health care provider. Make sure you discuss any questions you have with your health care provider.   Your pregnancy test today was positive. Take tylenol as needed for pain. Apply ice to affected area. Keep scheduled appointment with your OBGYN on Tuesday. Return to the ED if you experience vaginal bleeding, abdominal pain, fevers, chills, loss of control of your bowel or your bladder, numbness/tingling in both of your lower extremities.

## 2015-12-27 ENCOUNTER — Inpatient Hospital Stay (HOSPITAL_COMMUNITY)
Admission: AD | Admit: 2015-12-27 | Discharge: 2015-12-27 | Disposition: A | Discharge status: Home / Self Care | Payer: Managed Care, Other (non HMO) | Source: Ambulatory Visit | Attending: Obstetrics and Gynecology | Admitting: Obstetrics and Gynecology

## 2015-12-27 ENCOUNTER — Encounter (HOSPITAL_COMMUNITY): Payer: Self-pay | Admitting: *Deleted

## 2015-12-27 ENCOUNTER — Inpatient Hospital Stay (HOSPITAL_COMMUNITY): Payer: Managed Care, Other (non HMO)

## 2015-12-27 DIAGNOSIS — O4691 Antepartum hemorrhage, unspecified, first trimester: Secondary | ICD-10-CM | POA: Diagnosis not present

## 2015-12-27 DIAGNOSIS — O209 Hemorrhage in early pregnancy, unspecified: Secondary | ICD-10-CM | POA: Diagnosis not present

## 2015-12-27 DIAGNOSIS — Z3A08 8 weeks gestation of pregnancy: Secondary | ICD-10-CM | POA: Insufficient documentation

## 2015-12-27 DIAGNOSIS — B9689 Other specified bacterial agents as the cause of diseases classified elsewhere: Secondary | ICD-10-CM

## 2015-12-27 DIAGNOSIS — N760 Acute vaginitis: Secondary | ICD-10-CM

## 2015-12-27 LAB — URINE MICROSCOPIC-ADD ON

## 2015-12-27 LAB — URINALYSIS, ROUTINE W REFLEX MICROSCOPIC
Bilirubin Urine: NEGATIVE
GLUCOSE, UA: NEGATIVE mg/dL
KETONES UR: NEGATIVE mg/dL
Leukocytes, UA: NEGATIVE
Nitrite: NEGATIVE
PROTEIN: NEGATIVE mg/dL
Specific Gravity, Urine: 1.025 (ref 1.005–1.030)
pH: 5.5 (ref 5.0–8.0)

## 2015-12-27 LAB — CBC
HCT: 36.8 % (ref 36.0–46.0)
Hemoglobin: 12.3 g/dL (ref 12.0–15.0)
MCH: 28.3 pg (ref 26.0–34.0)
MCHC: 33.4 g/dL (ref 30.0–36.0)
MCV: 84.6 fL (ref 78.0–100.0)
PLATELETS: 280 10*3/uL (ref 150–400)
RBC: 4.35 MIL/uL (ref 3.87–5.11)
RDW: 12.9 % (ref 11.5–15.5)
WBC: 10 10*3/uL (ref 4.0–10.5)

## 2015-12-27 LAB — WET PREP, GENITAL
SPERM: NONE SEEN
TRICH WET PREP: NONE SEEN
Yeast Wet Prep HPF POC: NONE SEEN

## 2015-12-27 LAB — HCG, QUANTITATIVE, PREGNANCY: hCG, Beta Chain, Quant, S: 38592 m[IU]/mL — ABNORMAL HIGH (ref <5)

## 2015-12-27 MED ORDER — METRONIDAZOLE 500 MG PO TABS: 500.0000 mg | ORAL_TABLET | Freq: Two times a day (BID) | ORAL | Status: DC

## 2015-12-27 NOTE — MAU Note (Signed)
Pt states she started bleeding about 1 1/2 hours ago.  Denies pain.  Blood was in underwear & also on toilet tissue.

## 2015-12-27 NOTE — Discharge Instructions (Signed)

## 2015-12-27 NOTE — MAU Provider Note (Signed)
History     CSN: [id number]  Arrival date and time: 12/27/15 1518   None     Chief Complaint  Patient presents with  . Vaginal Bleeding   HPI Pt is [redacted]w[redacted]d pregnant [id number] who presents with vaginal bleeding in pregnancy. Pt has been to 1st OB appointment at [facility]- has not had a confirmed IUP. Pt c/o vaginal bleeding about 1 1/2 hours ago- on underwear and on toilet tissue. Bleeding has almost stopped now. Pt has hx of incompetent cervix with cerclage at 20 weeks and delivery of viable preterm female at 28 weeks. Pt does not have any cramping, denies constipation, headaches, UTI sx, fever, nausea/vomiting. RN note:      Expand All Collapse All   Pt states she started bleeding about 1 1/2 hours ago. Denies pain. Blood was in underwear & also on toilet tissue.       Past Medical History  Diagnosis Date  . Medical history non-contributory     Past Surgical History  Procedure Laterality Date  . Cervical cerclage N/A 09/19/2012    Procedure: CERCLAGE CERVICAL;  Surgeon: Oliver Pila, MD;  Location: [facility];  Service: Gynecology;  Laterality: N/A;    Family History  Problem Relation Age of Onset  . Diabetes Mother   . Hypertension Mother   . Diabetes Father   . Hypertension Father     Social History  Substance Use Topics  . Smoking status: Former Games developer  . Smokeless tobacco: None  . Alcohol Use: No    Allergies: No Known Allergies  Prescriptions prior to admission  Medication Sig Dispense Refill Last Dose  . acetaminophen (TYLENOL) 325 MG tablet Take 2 tablets (650 mg total) by mouth once. 30 tablet 0   . metroNIDAZOLE (FLAGYL) 500 MG tablet Take 1 tablet (500 mg total) by mouth 2 (two) times daily. 14 tablet 0     Review of Systems  Constitutional: Negative for fever and chills.  Gastrointestinal: Negative for nausea, vomiting, abdominal pain, diarrhea and constipation.  Genitourinary: Negative for dysuria.  Neurological: Negative for headaches.    Physical Exam   Blood pressure 122/81, pulse 77, temperature 98.3 F (36.8 C), temperature source Oral, resp. rate 18, height 5\' 7"  (1.702 m), weight 236 lb (107.049 kg), last menstrual period 10/30/2015.  Physical Exam  Nursing note and vitals reviewed. Constitutional: She is oriented to person, place, and time. She appears well-developed and well-nourished. No distress.  HENT:  Head: Normocephalic.  Eyes: Pupils are equal, round, and reactive to light.  Neck: Normal range of motion.  Cardiovascular: Normal rate.   Respiratory: Effort normal.  GI: Soft.  Genitourinary:  Small amount of dark red blood in vault; cervix parous closed; uterus NSSC NT; adnexa without palpable enlargement or tenderness  Musculoskeletal: Normal range of motion.  Neurological: She is alert and oriented to person, place, and time.  Skin: Skin is warm and dry.  Psychiatric: She has a normal mood and affect.    MAU Course  Procedures Results for orders placed or performed during the hospital encounter of 12/27/15 (from the past 24 hour(s))  Urinalysis, Routine w reflex microscopic (not at [facility])     Status: Abnormal   Collection Time: 12/27/15  4:06 PM  Result Value Ref Range   Color, Urine YELLOW YELLOW   APPearance CLEAR CLEAR   Specific Gravity, Urine 1.025 1.005 - 1.030   pH 5.5 5.0 - 8.0   Glucose, UA NEGATIVE NEGATIVE mg/dL   Hgb urine dipstick  LARGE (A) NEGATIVE   Bilirubin Urine NEGATIVE NEGATIVE   Ketones, ur NEGATIVE NEGATIVE mg/dL   Protein, ur NEGATIVE NEGATIVE mg/dL   Nitrite NEGATIVE NEGATIVE   Leukocytes, UA NEGATIVE NEGATIVE  Urine microscopic-add on     Status: Abnormal   Collection Time: 12/27/15  4:06 PM  Result Value Ref Range   Squamous Epithelial / LPF 0-5 (A) NONE SEEN   WBC, UA 0-5 0 - 5 WBC/hpf   RBC / HPF 6-30 0 - 5 RBC/hpf   Bacteria, UA RARE (A) NONE SEEN   Urine-Other MUCOUS PRESENT   Wet prep, genital     Status: Abnormal   Collection Time: 12/27/15  5:35 PM   Result Value Ref Range   Yeast Wet Prep HPF POC NONE SEEN NONE SEEN   Trich, Wet Prep NONE SEEN NONE SEEN   Clue Cells Wet Prep HPF POC PRESENT (A) NONE SEEN   WBC, Wet Prep HPF POC FEW (A) NONE SEEN   Sperm NONE SEEN   CBC     Status: None   Collection Time: 12/27/15  6:10 PM  Result Value Ref Range   WBC 10.0 4.0 - 10.5 K/uL   RBC 4.35 3.87 - 5.11 MIL/uL   Hemoglobin 12.3 12.0 - 15.0 g/dL   HCT 16.136.8 09.636.0 - 04.546.0 %   MCV 84.6 78.0 - 100.0 fL   MCH 28.3 26.0 - 34.0 pg   MCHC 33.4 30.0 - 36.0 g/dL   RDW 40.912.9 81.111.5 - 91.415.5 %   Platelets 280 150 - 400 K/uL  hCG, quantitative, pregnancy     Status: Abnormal   Collection Time: 12/27/15  6:10 PM  Result Value Ref Range   hCG, Beta Chain, Quant, S 38592 (H) <5 mIU/mL  [place] Ob Comp Less 14 Wks  12/27/2015  CLINICAL DATA:  Vaginal bleeding in a pregnant patient. EXAM: OBSTETRIC <14 WK US AND TRANSVAGINAL OB US TECHNIQUE: Both transabdominal and transvaginal ultrasound examinations were performed for complete evaluation of the gestation as well as the maternal uterus, adnexal regions, and pelvic cul-de-sac. Transvaginal technique was performed to assess early pregnancy. COMPARISON:  None for this pregnancy. FINDINGS: Intrauterine gestational sac: Present Yolk sac:  Present Embryo:  Present Cardiac Activity: Present Heart Rate: 159  bpm CRL:  157  mm   8 w   0 d                  US EDC: 08/08/2015 Subchorionic hemorrhage: None visualized. Several of the static images demonstrate echogenic debris within the gestational sac. The significance of this finding is uncertain. Maternal uterus/adnexae: Within normal limits. The right ovary measures 2.3 x 1.9 x 1.7 cm. The left ovary measures 2.9 x 2.1 x 2.5 cm. IMPRESSION: Single live intrauterine pregnancy which corresponds to 8 weeks 0 days gestational by crown-rump length. No subchorionic hemorrhage visualized. Several of the static images demonstrate echogenic debris within the gestational sac, and a small 3 mm  cystic structure within the fetus. The significance of these findings is uncertain. Short-term imaging follow-up with attention to the 2 findings is recommended. These results will be called to the ordering clinician or representative by the Radiologist Assistant, and communication documented in the PACS or zVision Dashboard. Electronically Signed   By: Ted Mcalpineobrinka  Dimitrova M.D.   On: 12/27/2015 19:03   [place] Ob Transvaginal  12/27/2015  CLINICAL DATA:  Vaginal bleeding in a pregnant patient. EXAM: OBSTETRIC <14 WK US AND TRANSVAGINAL OB US TECHNIQUE: Both transabdominal and transvaginal ultrasound  examinations were performed for complete evaluation of the gestation as well as the maternal uterus, adnexal regions, and pelvic cul-de-sac. Transvaginal technique was performed to assess early pregnancy. COMPARISON:  None for this pregnancy. FINDINGS: Intrauterine gestational sac: Present Yolk sac:  Present Embryo:  Present Cardiac Activity: Present Heart Rate: 159  bpm CRL:  157  mm   8 w   0 d                  [place] EDC: 08/08/2015 Subchorionic hemorrhage: None visualized. Several of the static images demonstrate echogenic debris within the gestational sac. The significance of this finding is uncertain. Maternal uterus/adnexae: Within normal limits. The right ovary measures 2.3 x 1.9 x 1.7 cm. The left ovary measures 2.9 x 2.1 x 2.5 cm. IMPRESSION: Single live intrauterine pregnancy which corresponds to 8 weeks 0 days gestational by crown-rump length. No subchorionic hemorrhage visualized. Several of the static images demonstrate echogenic debris within the gestational sac, and a small 3 mm cystic structure within the fetus. The significance of these findings is uncertain. Short-term imaging follow-up with attention to the 2 findings is recommended. These results will be called to the ordering clinician or representative by the Radiologist Assistant, and communication documented in the PACS or zVision Dashboard.  Electronically Signed   By: Ted Mcalpine M.D.   On: 12/27/2015 19:03  GC/chlamydia pending Discussed US findings with Dr. Ellyn Hack   Assessment and Plan  Vaginal bleeding in preg 1st trimester BV- Rx Flagyl  BId for 7 days F/u with Dr. Senaida Ores as scheduled  [facility] 12/27/2015, 5:21 PM

## 2015-12-28 LAB — GC/CHLAMYDIA PROBE AMP (~~LOC~~) NOT AT ARMC
Chlamydia: NEGATIVE
NEISSERIA GONORRHEA: NEGATIVE

## 2016-01-11 LAB — OB RESULTS CONSOLE GC/CHLAMYDIA
Chlamydia: NEGATIVE
GC PROBE AMP, GENITAL: NEGATIVE

## 2016-01-11 LAB — OB RESULTS CONSOLE RUBELLA ANTIBODY, IGM: RUBELLA: IMMUNE

## 2016-01-11 LAB — OB RESULTS CONSOLE HIV ANTIBODY (ROUTINE TESTING): HIV: NONREACTIVE

## 2016-01-11 LAB — OB RESULTS CONSOLE HEPATITIS B SURFACE ANTIGEN: Hepatitis B Surface Ag: NEGATIVE

## 2016-01-11 LAB — OB RESULTS CONSOLE RPR: RPR: NONREACTIVE

## 2016-01-14 ENCOUNTER — Encounter (HOSPITAL_COMMUNITY): Payer: Self-pay | Admitting: *Deleted

## 2016-02-07 NOTE — H&P (Signed)
Natalie Dunn is an 25 y.o. female [phone] at 48 3/7  (EDD 08/05/16 by LMP c/w 10 week [place]) presents for placement of prophylactic cerclage for a probable incompetent cervix.   Pt had a prior PTD at 27 weeks after had funneling at 19 weeks and placement of a rescue cerclage.  She had painful contractions followed by SROM at 27 weeks and then labored and delivered.  She will receive 17-P this pregnancy as well.    Pertinent Gynecological History: OB History:   SAB x2                        EAB x 1                       NSVD 2014 2#5oz 27 weeks   Menstrual History:  Patient's last menstrual period was 10/30/2015.    Past Medical History:  Diagnosis Date  . SVD (spontaneous vaginal delivery) 11/2012   x 1    Past Surgical History:  Procedure Laterality Date  . CERVICAL CERCLAGE N/A 09/19/2012   Procedure: CERCLAGE CERVICAL;  Surgeon: Oliver Pila, MD;  Location: [facility];  Service: Gynecology;  Laterality: N/A;    Family History  Problem Relation Age of Onset  . Diabetes Mother   . Hypertension Mother   . Diabetes Father   . Hypertension Father     Social History:  reports that she has quit smoking. She has never used smokeless tobacco. She reports that she does not drink alcohol or use drugs.  Allergies: No Known Allergies  No prescriptions prior to admission.    Review of Systems  Constitutional: Negative for fever.  Gastrointestinal: Negative for abdominal pain.    Height 5\' 7"  (1.702 m), weight 107 kg (236 lb), last menstrual period 10/30/2015. Physical Exam  Constitutional: She appears well-developed and well-nourished.  Cardiovascular: Normal rate and regular rhythm.   Respiratory: Effort normal.  GI: Soft.  Genitourinary: Vagina normal.  Genitourinary Comments: Uterus 14 week size  Neurological: She is alert.  Psychiatric: She has a normal mood and affect.    No results found for this or any previous visit (from the past 24 hour(s)).  No results  found.  Assessment/Plan: Pt has been counseled on risks and benefits of cerclage including bleeding, infection and possible SROM with fetal loss.  Blood type is O positive and all prenatal screens WNL.  Oliver Pila 02/07/2016, 10:28 AM

## 2016-02-08 ENCOUNTER — Encounter (HOSPITAL_COMMUNITY)
Admission: RE | Disposition: A | Discharge status: Home / Self Care | Payer: Self-pay | Source: Ambulatory Visit | Attending: Obstetrics and Gynecology

## 2016-02-08 ENCOUNTER — Encounter (HOSPITAL_COMMUNITY): Payer: Self-pay | Admitting: *Deleted

## 2016-02-08 ENCOUNTER — Ambulatory Visit (HOSPITAL_COMMUNITY): Payer: Managed Care, Other (non HMO) | Admitting: Anesthesiology

## 2016-02-08 ENCOUNTER — Ambulatory Visit (HOSPITAL_COMMUNITY)
Admission: RE | Admit: 2016-02-08 | Discharge: 2016-02-08 | Disposition: A | Discharge status: Home / Self Care | Payer: Managed Care, Other (non HMO) | Source: Ambulatory Visit | Attending: Obstetrics and Gynecology | Admitting: Obstetrics and Gynecology

## 2016-02-08 DIAGNOSIS — O3432 Maternal care for cervical incompetence, second trimester: Secondary | ICD-10-CM | POA: Diagnosis present

## 2016-02-08 DIAGNOSIS — Z3A14 14 weeks gestation of pregnancy: Secondary | ICD-10-CM | POA: Diagnosis not present

## 2016-02-08 DIAGNOSIS — Z87891 Personal history of nicotine dependence: Secondary | ICD-10-CM | POA: Diagnosis not present

## 2016-02-08 HISTORY — PX: CERVICAL CERCLAGE: SHX1329

## 2016-02-08 LAB — CBC
HCT: 35.1 % — ABNORMAL LOW (ref 36.0–46.0)
HEMOGLOBIN: 12 g/dL (ref 12.0–15.0)
MCH: 28.2 pg (ref 26.0–34.0)
MCHC: 34.2 g/dL (ref 30.0–36.0)
MCV: 82.6 fL (ref 78.0–100.0)
Platelets: 246 10*3/uL (ref 150–400)
RBC: 4.25 MIL/uL (ref 3.87–5.11)
RDW: 12.9 % (ref 11.5–15.5)
WBC: 8.1 10*3/uL (ref 4.0–10.5)

## 2016-02-08 SURGERY — CERCLAGE, CERVIX, VAGINAL APPROACH
Anesthesia: Spinal | Site: Vagina

## 2016-02-08 MED ORDER — 0.9 % SODIUM CHLORIDE (POUR BTL) OPTIME
TOPICAL | Status: DC | PRN
  Administered 2016-02-08: 1000 mL

## 2016-02-08 MED ORDER — BUPIVACAINE IN DEXTROSE 0.75-8.25 % IT SOLN
INTRATHECAL | Status: DC | PRN
Start: 2016-02-08 — End: 2016-02-08
  Administered 2016-02-08: 10 mg via INTRATHECAL

## 2016-02-08 MED ORDER — LACTATED RINGERS IV SOLN: INTRAVENOUS | Status: DC

## 2016-02-08 MED ORDER — PHENYLEPHRINE 8 MG IN D5W 100 ML (0.08MG/ML) PREMIX OPTIME
INJECTION | INTRAVENOUS | Status: DC | PRN
  Administered 2016-02-08: 60 ug/min via INTRAVENOUS

## 2016-02-08 MED ORDER — LACTATED RINGERS IV SOLN
INTRAVENOUS | Status: DC
  Administered 2016-02-08 (×2): via INTRAVENOUS

## 2016-02-08 MED ORDER — PHENYLEPHRINE 8 MG IN D5W 100 ML (0.08MG/ML) PREMIX OPTIME
INJECTION | INTRAVENOUS | Status: AC
  Filled 2016-02-08: qty 100

## 2016-02-08 SURGICAL SUPPLY — 19 items
CLOTH BEACON ORANGE TIMEOUT ST (SAFETY) ×3 IMPLANT
COUNTER NEEDLE 1200 MAGNETIC (NEEDLE) ×3 IMPLANT
GLOVE BIO SURGEON STRL SZ 6.5 (GLOVE) ×4 IMPLANT
GLOVE BIO SURGEONS STRL SZ 6.5 (GLOVE) ×2
GLOVE BIOGEL PI IND STRL 7.0 (GLOVE) ×1 IMPLANT
GLOVE BIOGEL PI INDICATOR 7.0 (GLOVE) ×2
GOWN STRL REUS W/TWL LRG LVL3 (GOWN DISPOSABLE) ×6 IMPLANT
NEEDLE MAYO .5 CIRCLE (NEEDLE) ×3 IMPLANT
PACK VAGINAL MINOR WOMEN LF (CUSTOM PROCEDURE TRAY) ×3 IMPLANT
PAD OB MATERNITY 4.3X12.25 (PERSONAL CARE ITEMS) ×3 IMPLANT
PAD PREP 24X48 CUFFED NSTRL (MISCELLANEOUS) ×3 IMPLANT
SUT POLYDEK 5 CE 75 36 (SUTURE) ×6 IMPLANT
SYR BULB IRRIGATION 50ML (SYRINGE) IMPLANT
TOWEL OR 17X24 6PK STRL BLUE (TOWEL DISPOSABLE) ×6 IMPLANT
TRAY FOLEY CATH SILVER 14FR (SET/KITS/TRAYS/PACK) ×3 IMPLANT
TUBING NON-CON 1/4 X 20 CONN (TUBING) ×2 IMPLANT
TUBING NON-CON 1/4 X 20' CONN (TUBING) ×1
WATER STERILE IRR 1000ML POUR (IV SOLUTION) ×3 IMPLANT
YANKAUER SUCT BULB TIP NO VENT (SUCTIONS) ×3 IMPLANT

## 2016-02-08 NOTE — Transfer of Care (Signed)
Immediate Anesthesia Transfer of Care Note  Patient: Natalie Dunn  Procedure(s) Performed: Procedure(s): CERCLAGE CERVICAL (N/A)  Patient Location: PACU  Anesthesia Type:Spinal  Level of Consciousness: awake, alert  and oriented  Airway & Oxygen Therapy: Patient Spontanous Breathing  Post-op Assessment: Report given to RN and Post -op Vital signs reviewed and stable  Post vital signs: Reviewed and stable  Last Vitals:  Vitals:   02/08/16 0623  BP: 125/90  Pulse: 81  Resp: 18  Temp: 36.5 C    Last Pain:  Vitals:   02/08/16 0623  TempSrc: Oral      Patients Stated Pain Goal: 3 (02/08/16 7628)  Complications: No apparent anesthesia complications

## 2016-02-08 NOTE — Anesthesia Procedure Notes (Addendum)
Spinal  Patient location during procedure: OR Start time: 02/08/2016 7:14 AM End time: 02/08/2016 7:16 AM Staffing Anesthesiologist: Suzette Battiest Performed: anesthesiologist  Preanesthetic Checklist Completed: patient identified, site marked, surgical consent, pre-op evaluation, timeout performed, IV checked, risks and benefits discussed and monitors and equipment checked Spinal Block Patient position: sitting Prep: DuraPrep Patient monitoring: heart rate, cardiac monitor, continuous pulse ox and blood pressure Approach: midline Location: L3-4 Injection technique: single-shot Needle Needle type: Pencan  Needle gauge: 24 G Assessment Sensory level: T10 Additional Notes Expiration date of kit checked and confirmed. Patient tolerated procedure well, without complications.

## 2016-02-08 NOTE — Progress Notes (Signed)
Patient ID: Natalie Dunn, female   DOB: 1990-10-26, 26 y.o.   MRN: [id number] Per pt no changes in dictated H&P.  Brief exam WNL. Ready to proceed.

## 2016-02-08 NOTE — Anesthesia Preprocedure Evaluation (Signed)
Anesthesia Evaluation  Patient identified by MRN, date of birth, ID band Patient awake    Reviewed: Allergy & Precautions, NPO status , Patient's Chart, lab work & pertinent test results  Airway Mallampati: I  TM Distance: >3 FB Neck ROM: Full    Dental  (+) Teeth Intact   Pulmonary former smoker,    breath sounds clear to auscultation       Cardiovascular negative cardio ROS   Rhythm:Regular Rate:Normal     Neuro/Psych negative neurological ROS     GI/Hepatic negative GI ROS, Neg liver ROS,   Endo/Other  negative endocrine ROS  Renal/GU negative Renal ROS     Musculoskeletal   Abdominal   Peds  Hematology negative hematology ROS (+)   Anesthesia Other Findings   Reproductive/Obstetrics (+) Pregnancy                             Lab Results  Component Value Date   WBC 8.1 02/08/2016   HGB 12.0 02/08/2016   HCT 35.1 (L) 02/08/2016   MCV 82.6 02/08/2016   PLT 246 02/08/2016     Anesthesia Physical Anesthesia Plan  ASA: II  Anesthesia Plan: Spinal   Post-op Pain Management:    Induction:   Airway Management Planned: Natural Airway  Additional Equipment:   Intra-op Plan:   Post-operative Plan:   Informed Consent: I have reviewed the patients History and Physical, chart, labs and discussed the procedure including the risks, benefits and alternatives for the proposed anesthesia with the patient or authorized representative who has indicated his/her understanding and acceptance.     Plan Discussed with: CRNA  Anesthesia Plan Comments:         Anesthesia Quick Evaluation

## 2016-02-08 NOTE — Discharge Instructions (Signed)
[place] - Preparing for Surgery ° °Before surgery, you can play an important role.  Because skin is not sterile, your skin needs to be as free of germs as possible.  You can reduce the number of germs on you skin by washing with CHG (chlorahexidine gluconate) soap before surgery.  CHG is an antiseptic cleaner which kills germs and bonds with the skin to continue killing germs even after washing. ° °Please DO NOT use if you have an allergy to CHG or antibacterial soaps.  If your skin becomes reddened/irritated stop using the CHG and inform your nurse when you arrive at Short Stay. ° °Do not shave (including legs and underarms) for at least 48 hours prior to the first CHG shower.  You may shave your face. ° °Please follow these instructions carefully: ° ° 1.  Shower with CHG Soap the night before surgery and the                                morning of Surgery. ° 2.  If you choose to wash your hair, wash your hair first as usual with your       normal shampoo. ° 3.  After you shampoo, rinse your hair and body thoroughly to remove the                      Shampoo. ° 4.  Use CHG as you would any other liquid soap.  You can apply chg directly       to the skin and wash gently with scrungie or a clean washcloth. ° 5.  Apply the CHG Soap to your body ONLY FROM THE NECK DOWN.        Do not use on open wounds or open sores.  Avoid contact with your eyes,       ears, mouth and genitals (private parts).  Wash genitals (private parts)       with your normal soap. ° 6.  Wash thoroughly, paying special attention to the area where your surgery        will be performed. ° 7.  Thoroughly rinse your body with warm water from the neck down. ° 8.  DO NOT shower/wash with your normal soap after using and rinsing off       the CHG Soap. ° 9.  Pat yourself dry with a clean towel. °           10.  Wear clean pajamas. °           11.  Place clean sheets on your bed the night of your first shower and do not        sleep with  pets. ° °Day of Surgery ° °Do not apply any lotions/deoderants the morning of surgery.  Please wear clean clothes to the hospital/surgery center. ° ° °

## 2016-02-08 NOTE — Anesthesia Postprocedure Evaluation (Signed)
Anesthesia Post Note  Patient: Natalie Dunn  Procedure(s) Performed: Procedure(s) (LRB): CERCLAGE CERVICAL (N/A)  Patient location during evaluation: PACU Anesthesia Type: Spinal Level of consciousness: oriented and awake and alert Pain management: pain level controlled Vital Signs Assessment: post-procedure vital signs reviewed and stable Respiratory status: spontaneous breathing, respiratory function stable and patient connected to nasal cannula oxygen Cardiovascular status: blood pressure returned to baseline and stable Postop Assessment: no headache, no backache, spinal receding, no signs of nausea or vomiting and patient able to bend at knees Anesthetic complications: no     Last Vitals:  Vitals:   02/08/16 0945 02/08/16 1000  BP: 117/76 115/73  Pulse: 90 84  Resp: 20 14  Temp:      Last Pain:  Vitals:   02/08/16 0623  TempSrc: Oral   Pain Goal: Patients Stated Pain Goal: 3 (02/08/16 0623)               Cecile Hearing

## 2016-02-08 NOTE — Op Note (Signed)
Operative Note    Preoperative Diagnosis Pregnancy at [redacted] weeks gestation Incompetent cervix  Postoperative Diagnosis same  Procedure McDonald Cerclage x 2 (knots at 12 o'clock)  Surgeon Huel Cote, MD  Anesthesia Spinal  Fluids: EBL 50cc UOP 100cc IVF 800cc LR  Findings Normal appearing cervix 3+cm long  Specimen None  Procedure NoteThe patient was taken to the operating room where spinal anesthesia was obtained without difficulty.  She was then prepped and draped in a normal sterile fashion in the dorsal lithotomy position.  An appropriate timeout was performed.  A weighted speculum was placed in the vagina as well as two deaver retractors to expose the cervix.  A pursestring suture of 0 polydek was then placed circumferentially around the cervix.  This was then gently retracted forward and a second of the same suture placed just behind it circumferentially as well.  The knots were tied at 12 o'clock.  At the conclusion of the procedure the vagina was irrigated and no active bleeding noted.  The patient was taken to the recovery room in good condition and FHT's auscultated there were WNL.

## 2016-02-11 ENCOUNTER — Encounter (HOSPITAL_COMMUNITY): Payer: Self-pay | Admitting: Obstetrics and Gynecology

## 2016-04-04 DIAGNOSIS — O26873 Cervical shortening, third trimester: Secondary | ICD-10-CM | POA: Insufficient documentation

## 2016-04-04 DIAGNOSIS — O09219 Supervision of pregnancy with history of pre-term labor, unspecified trimester: Secondary | ICD-10-CM | POA: Insufficient documentation

## 2016-04-14 ENCOUNTER — Ambulatory Visit (INDEPENDENT_AMBULATORY_CARE_PROVIDER_SITE_OTHER): Payer: Managed Care, Other (non HMO) | Admitting: *Deleted

## 2016-04-14 DIAGNOSIS — O3432 Maternal care for cervical incompetence, second trimester: Secondary | ICD-10-CM

## 2016-04-14 MED ORDER — BETAMETHASONE SOD PHOS & ACET 6 (3-3) MG/ML IJ SUSP
12.0000 mg | Freq: Every day | INTRAMUSCULAR | Status: AC
  Administered 2016-04-14: 12 mg via INTRAMUSCULAR

## 2016-04-14 NOTE — Progress Notes (Signed)
Pt in for Betamethasone from Dr. Senaida Oresichardson office. Order is scanned in her chart. Dose given and tolerated well. Spoke with Dr. Erin FullingHarraway Smith about timing of second dose and she stated patient would need to go to MAU for second dose. Pt advised to return to MAU tomorrow for her second dose as we are closing at 12.

## 2016-04-15 ENCOUNTER — Ambulatory Visit: Payer: Managed Care, Other (non HMO)

## 2016-04-15 ENCOUNTER — Inpatient Hospital Stay (HOSPITAL_COMMUNITY)
Admission: AD | Admit: 2016-04-15 | Discharge: 2016-04-15 | Disposition: A | Discharge status: Home / Self Care | Payer: Managed Care, Other (non HMO) | Source: Ambulatory Visit | Attending: Obstetrics and Gynecology | Admitting: Obstetrics and Gynecology

## 2016-04-15 DIAGNOSIS — Z3A25 25 weeks gestation of pregnancy: Secondary | ICD-10-CM | POA: Diagnosis not present

## 2016-04-15 DIAGNOSIS — Z3492 Encounter for supervision of normal pregnancy, unspecified, second trimester: Secondary | ICD-10-CM | POA: Diagnosis not present

## 2016-04-15 MED ORDER — BETAMETHASONE SOD PHOS & ACET 6 (3-3) MG/ML IJ SUSP
12.0000 mg | Freq: Once | INTRAMUSCULAR | Status: AC
  Administered 2016-04-15: 12 mg via INTRAMUSCULAR
  Filled 2016-04-15: qty 2

## 2016-04-21 ENCOUNTER — Encounter (HOSPITAL_COMMUNITY): Payer: Self-pay

## 2016-04-21 ENCOUNTER — Observation Stay (HOSPITAL_COMMUNITY)
Admission: AD | Admit: 2016-04-21 | Discharge: 2016-04-23 | Disposition: A | Discharge status: Home / Self Care | Payer: Managed Care, Other (non HMO) | Source: Ambulatory Visit | Attending: Obstetrics and Gynecology | Admitting: Obstetrics and Gynecology

## 2016-04-21 ENCOUNTER — Encounter (HOSPITAL_COMMUNITY): Payer: Self-pay | Admitting: Anesthesiology

## 2016-04-21 ENCOUNTER — Inpatient Hospital Stay (HOSPITAL_COMMUNITY): Payer: Managed Care, Other (non HMO)

## 2016-04-21 DIAGNOSIS — O3432 Maternal care for cervical incompetence, second trimester: Secondary | ICD-10-CM | POA: Diagnosis present

## 2016-04-21 DIAGNOSIS — Z3A24 24 weeks gestation of pregnancy: Secondary | ICD-10-CM | POA: Insufficient documentation

## 2016-04-21 DIAGNOSIS — O4692 Antepartum hemorrhage, unspecified, second trimester: Secondary | ICD-10-CM

## 2016-04-21 DIAGNOSIS — Z87891 Personal history of nicotine dependence: Secondary | ICD-10-CM | POA: Diagnosis not present

## 2016-04-21 LAB — URINE MICROSCOPIC-ADD ON

## 2016-04-21 LAB — TYPE AND SCREEN
ABO/RH(D): O POS
ANTIBODY SCREEN: NEGATIVE

## 2016-04-21 LAB — URINALYSIS, ROUTINE W REFLEX MICROSCOPIC
BILIRUBIN URINE: NEGATIVE
Glucose, UA: NEGATIVE mg/dL
Ketones, ur: 15 mg/dL — AB
Leukocytes, UA: NEGATIVE
Nitrite: NEGATIVE
PROTEIN: NEGATIVE mg/dL
Specific Gravity, Urine: 1.015 (ref 1.005–1.030)
pH: 8 (ref 5.0–8.0)

## 2016-04-21 LAB — OB RESULTS CONSOLE GBS: GBS: POSITIVE

## 2016-04-21 MED ORDER — LACTATED RINGERS IV SOLN
INTRAVENOUS | Status: DC
  Administered 2016-04-21 – 2016-04-22 (×2): via INTRAVENOUS

## 2016-04-21 MED ORDER — HYDROXYPROGESTERONE CAPROATE 250 MG/ML IM OIL: 250.0000 mg | TOPICAL_OIL | INTRAMUSCULAR | Status: DC

## 2016-04-21 MED ORDER — PRENATAL MULTIVITAMIN CH
1.0000 | ORAL_TABLET | Freq: Every day | ORAL | Status: DC
  Administered 2016-04-22 (×2): 1 via ORAL
  Filled 2016-04-21 (×2): qty 1

## 2016-04-21 MED ORDER — ZOLPIDEM TARTRATE 5 MG PO TABS: 5.0000 mg | ORAL_TABLET | Freq: Every evening | ORAL | Status: DC | PRN

## 2016-04-21 MED ORDER — DOCUSATE SODIUM 100 MG PO CAPS
100.0000 mg | ORAL_CAPSULE | Freq: Every day | ORAL | Status: DC
Start: 2016-04-22 — End: 2016-04-23
  Administered 2016-04-22 – 2016-04-23 (×2): 100 mg via ORAL
  Filled 2016-04-21 (×2): qty 1

## 2016-04-21 MED ORDER — PRENATAL MULTIVITAMIN CH: 1.0000 | ORAL_TABLET | Freq: Every day | ORAL | Status: DC

## 2016-04-21 MED ORDER — HYDROXYPROGESTERONE CAPROATE 250 MG/ML IM OIL
250.0000 mg | TOPICAL_OIL | INTRAMUSCULAR | Status: DC
  Filled 2016-04-21: qty 1

## 2016-04-21 MED ORDER — CALCIUM CARBONATE ANTACID 500 MG PO CHEW: 2.0000 | CHEWABLE_TABLET | ORAL | Status: DC | PRN

## 2016-04-21 MED ORDER — ACETAMINOPHEN 325 MG PO TABS: 650.0000 mg | ORAL_TABLET | ORAL | Status: DC | PRN

## 2016-04-21 MED ORDER — PROGESTERONE MICRONIZED 200 MG PO CAPS
200.0000 mg | ORAL_CAPSULE | Freq: Every day | ORAL | Status: DC
  Administered 2016-04-22 (×2): 200 mg via VAGINAL
  Filled 2016-04-21 (×2): qty 1

## 2016-04-21 NOTE — MAU Provider Note (Signed)
History     CSN: [id number]  Arrival date and time: 04/21/16 1721   First Provider Initiated Contact with Patient 04/21/16 1754      Chief Complaint  Patient presents with  . Vaginal Bleeding   HPI Natalie Dunn is a 25 y.o. [phone] [id number] at 345w6d who presents with vaginal bleeding. States shortly prior to coming to MAU she had a gush of bright red blood and passes a few small clots. Since then has seen some spotting. Also noted abdominal pressure in LLQ that comes & goes; no pain. Is on pelvic rest d/t cervical insufficiency and has a cerclage in place. Denies n/v/d, constipation, dysuria, LOF. No intercourse. Last BM this morning, didn't have to strain.   OB History    Gravida Para Term Preterm AB Living   4 1   1 2 1    SAB TAB Ectopic Multiple Live Births   2              Past Medical History:  Diagnosis Date  . SVD (spontaneous vaginal delivery) 11/2012   x 1    Past Surgical History:  Procedure Laterality Date  . CERVICAL CERCLAGE N/A 09/19/2012   Procedure: CERCLAGE CERVICAL;  Surgeon: Oliver PilaKathy W Richardson, MD;  Location: [facility];  Service: Gynecology;  Laterality: N/A;  . CERVICAL CERCLAGE N/A 02/08/2016   Procedure: CERCLAGE CERVICAL;  Surgeon: Huel CoteKathy Richardson, MD;  Location: [facility];  Service: Gynecology;  Laterality: N/A;    Family History  Problem Relation Age of Onset  . Diabetes Mother   . Hypertension Mother   . Diabetes Father   . Hypertension Father     Social History  Substance Use Topics  . Smoking status: Former Games developermoker  . Smokeless tobacco: Never Used  . Alcohol use No    Allergies: No Known Allergies  Prescriptions Prior to Admission  Medication Sig Dispense Refill Last Dose  . guaifenesin (ROBITUSSIN) 100 MG/5ML syrup Take 200 mg by mouth 3 (three) times daily as needed for cough.   02/07/2016 at Unknown time  . Prenatal Vit-Fe Fumarate-FA (PRENATAL MULTIVITAMIN) TABS tablet Take 1 tablet by mouth at bedtime.   02/07/2016 at Unknown time     Review of Systems  Constitutional: Negative.   Gastrointestinal: Negative.   Genitourinary: Negative for dysuria.       + vaginal bleeding   Physical Exam   Blood pressure 119/78, pulse 84, temperature 98.3 F (36.8 C), resp. rate 18, last menstrual period 10/30/2015.  Physical Exam  Nursing note and vitals reviewed. Constitutional: She is oriented to person, place, and time. She appears well-developed and well-nourished. No distress.  HENT:  Head: Normocephalic and atraumatic.  Eyes: Conjunctivae are normal. Right eye exhibits no discharge. Left eye exhibits no discharge. No scleral icterus.  Neck: Normal range of motion.  Respiratory: Effort normal. No respiratory distress.  GI: Soft. There is no tenderness.  Genitourinary: Cervix exhibits no friability. There is bleeding (minimal amount of dark red/Iten blood) in the vagina.  Genitourinary Comments: Cerclage visualized, knot at 1 o'clock.  No active bleeding  Neurological: She is alert and oriented to person, place, and time.  Skin: Skin is warm and dry. She is not diaphoretic.  Psychiatric: She has a normal mood and affect. Her behavior is normal. Judgment and thought content normal.   Fetal Tracing:  Baseline: 150 Variability: moderate Accelerations: 10x10 Decelerations: few small variables  Toco: none   MAU Course  Procedures Results for orders placed or performed during the  hospital encounter of 04/21/16 (from the past 24 hour(s))  Urinalysis, Routine w reflex microscopic (not at [facility])     Status: Abnormal   Collection Time: 04/21/16  5:40 PM  Result Value Ref Range   Color, Urine YELLOW YELLOW   APPearance CLEAR CLEAR   Specific Gravity, Urine 1.015 1.005 - 1.030   pH 8.0 5.0 - 8.0   Glucose, UA NEGATIVE NEGATIVE mg/dL   Hgb urine dipstick SMALL (A) NEGATIVE   Bilirubin Urine NEGATIVE NEGATIVE   Ketones, ur 15 (A) NEGATIVE mg/dL   Protein, ur NEGATIVE NEGATIVE mg/dL   Nitrite NEGATIVE NEGATIVE    Leukocytes, UA NEGATIVE NEGATIVE  Urine microscopic-add on     Status: Abnormal   Collection Time: 04/21/16  5:40 PM  Result Value Ref Range   Squamous Epithelial / LPF 0-5 (A) NONE SEEN   WBC, UA 0-5 0 - 5 WBC/hpf   RBC / HPF 0-5 0 - 5 RBC/hpf   Bacteria, UA RARE (A) NONE SEEN    MDM Cerclage appear to be in place on exam Category 1 tracing, no contractions Ultrasound shows no cervical length with funneling down to cerclage Dr. Ellyn Hack notified of patient's presentation and ultrasound report.  Dr. Ellyn Hack on unit to speak with pt regarding ultrasound and plan of care Assessment and Plan  A: 1. Vaginal bleeding in pregnancy, second trimester   2. Cervical insufficiency during pregnancy in second trimester, antepartum   3. Cervical cerclage suture present in second trimester   4. [redacted] weeks gestation of pregnancy    P: Dr. Ellyn Hack in with patient to discuss plan of care   Judeth Horn 04/21/2016, 5:57 PM

## 2016-04-21 NOTE — MAU Note (Signed)
Pt presents to MAU with complaints of passing small clots earlier today. Pressure on her left lower side. HX of 28 week delivery and currently has a cerclage.

## 2016-04-21 NOTE — H&P (Signed)
Natalie Dunn is a 24 y.o. female [phone] at 24+6 with VB, cervical incompetence and cerclage.  CL unmeasurable.  SVE cl/90/0.  Pregnancy dated by LMP c/w First Trimester [place].  Has received BMZ 10/5 and 6.  D/w NICU and MFM.  MFM recommends admit and monitoring as had VB.  Intermittent vaginal pressure, to monitor, recheck cervical length 10/13.  Reviewed pictures of today's scan and previous US with Dr Sherrie George.    OB History    Gravida Para Term Preterm AB Living   4 1   1 2 1    SAB TAB Ectopic Multiple Live Births   2            TAB, SAB x 2; 27wk SVD after em cerclage at 20 wk.   Past Medical History:  Diagnosis Date  . SVD (spontaneous vaginal delivery) 11/2012   x 1   Past Surgical History:  Procedure Laterality Date  . CERVICAL CERCLAGE N/A 09/19/2012   Procedure: CERCLAGE CERVICAL;  Surgeon: Oliver Pila, MD;  Location: [facility];  Service: Gynecology;  Laterality: N/A;  . CERVICAL CERCLAGE N/A 02/08/2016   Procedure: CERCLAGE CERVICAL;  Surgeon: Huel Cote, MD;  Location: [facility];  Service: Gynecology;  Laterality: N/A;   Family History: family history includes Diabetes in her father and mother; Hypertension in her father and mother. Social History:  reports that she has quit smoking. She has never used smokeless tobacco. She reports that she does not drink alcohol or use drugs. single Meds PNV, Makena All NKDA     Maternal Diabetes: No Genetic Screening: Normal Maternal Ultrasounds/Referrals: Normal X short CL Fetal Ultrasounds or other Referrals:  None Maternal Substance Abuse:  No Significant Maternal Medications:  Meds include: Other: Makena Significant Maternal Lab Results:  None Other Comments:  short cervix s/p cerclage, s/p BMZ  Review of Systems  Constitutional: Negative.   HENT: Negative.   Eyes: Negative.   Respiratory: Negative.   Cardiovascular: Negative.   Gastrointestinal: Negative.   Genitourinary:       Some pressure  Musculoskeletal:  Negative.   Skin: Negative.   Neurological: Negative.   Psychiatric/Behavioral: Negative.    Maternal Medical History:  Contractions: Frequency: irregular.    Fetal activity: Perceived fetal activity is normal.      Dilation: Closed Effacement (%): 90 Station: 0 Exam by:: Dr. Ellyn Hack Blood pressure 119/78, pulse 84, temperature 98.3 F (36.8 C), resp. rate 18, last menstrual period 10/30/2015. Maternal Exam:  Abdomen: Patient reports no abdominal tenderness. Fundal height is appropriate for gestation.   Fetal presentation: vertex  Introitus: Normal vulva. Normal vagina.  Cervix: Cervix evaluated by digital exam.     Physical Exam  Constitutional: She is oriented to person, place, and time. She appears well-developed and well-nourished.  HENT:  Head: Normocephalic and atraumatic.  Cardiovascular: Normal rate and regular rhythm.   Respiratory: Effort normal and breath sounds normal. No respiratory distress. She has no wheezes.  GI: Soft. Bowel sounds are normal. She exhibits no distension. There is no tenderness.  Musculoskeletal: Normal range of motion.  Neurological: She is alert and oriented to person, place, and time.  Skin: Skin is warm and dry.  Psychiatric: She has a normal mood and affect. Her behavior is normal.    Prenatal labs: ABO, Rh:  O+ Antibody:  neg Rubella:  immune RPR:   NR HBsAg:   neg HIV:   neg GBS:   unknown  Hgb 12.5/Plt 282/Ur Cx neg/GC neg/Chl neg/CF neg/First trimester  screen WNL, nl NT,   US followed cervical length after cerclage...Marland Kitchen.decreased to 0.7cm,  Assessment/Plan: 25yo [id number] at 24+6 with cervical incompetence 1.  Continue Makena and add vaginal progesterone 2. S/p BMZ 3. Close monitoring 4. rescan CL 5. Consider Indomethacin  Bovard-Stuckert, Adler Alton 04/21/2016, 10:03 PM

## 2016-04-22 ENCOUNTER — Observation Stay (HOSPITAL_COMMUNITY): Payer: Managed Care, Other (non HMO)

## 2016-04-22 ENCOUNTER — Encounter (HOSPITAL_COMMUNITY): Payer: Self-pay | Admitting: *Deleted

## 2016-04-22 DIAGNOSIS — O3432 Maternal care for cervical incompetence, second trimester: Secondary | ICD-10-CM | POA: Diagnosis not present

## 2016-04-22 NOTE — Progress Notes (Signed)
No pain or cramping, dark blood is less, feels some pelvic pressure Afeb, VSS FHT- 150s, reactive U/s report pending Will keep overnight, plan to d/c ian am if remains stable

## 2016-04-22 NOTE — Progress Notes (Signed)
HD #2, [redacted]W[redacted]D, incompetent cervix with cerclage Feeling ok, no pain, cramping or ctx, some dark blood, +FM, no LOF Afeb, VSS FHT- last on monitor last pm 150s Will continue progesterone, monitor today for further bleeding or ctx

## 2016-04-22 NOTE — Consult Note (Addendum)
[facility]  [facility] 04/22/2016    2:46 PM  Neonatal Medicine Consultation         [name]          MRN:  [id number]  I was called at the request of the patient's obstetrician (Dr. Ellyn HackBovard) to speak to this patient due to potential preterm delivery as early as 25 weeks.  The patient's prenatal course includes incompetent and short cervix, cerclage.  She is 25 weeks currently.  She is admitted to antenatal unit, and is receiving treatment that includes progesterone.  She received betamethasone on 10/5 and 10/6.  The baby is a female.  I reviewed expectations for a baby born at 25+ weeks, including survival, length of stay, morbidities such as respiratory distress, IVH, infection, feeding intolerance, retinopathy.  I described how we provide respiratory and feeding support.  Mom plans to breast feed, which I encouraged as best for the baby.  I let mom know that the baby's outlook generally improves the longer she remains undelivered.  She had a 28-week baby about 3 years ago that was admitted to our NICU, and is currently doing well.  I spent 20 minutes reviewing the record, speaking to the patient, and entering appropriate documentation.  More than 50% of the time was spent face to face with patient.   _____________________ Electronically Signed By: Angelita InglesMcCrae S. Bijon Mineer, MD Neonatologist

## 2016-04-23 ENCOUNTER — Encounter (HOSPITAL_COMMUNITY): Payer: Self-pay | Admitting: *Deleted

## 2016-04-23 DIAGNOSIS — O3432 Maternal care for cervical incompetence, second trimester: Secondary | ICD-10-CM | POA: Diagnosis not present

## 2016-04-23 LAB — CULTURE, BETA STREP (GROUP B ONLY)

## 2016-04-23 MED ORDER — PROGESTERONE MICRONIZED 200 MG PO CAPS: 200.0000 mg | ORAL_CAPSULE | Freq: Every day | ORAL | 5 refills | Status: DC

## 2016-04-23 NOTE — Progress Notes (Signed)
Critical value for positive Group B strep called by the lab. Dr Jackelyn KnifeMeisinger notified - no treatment at this time.

## 2016-04-23 NOTE — Discharge Summary (Signed)
Physician Discharge Summary  Patient ID: Natalie DustKiara A Jefferson MRN: [id number] DOB/AGE: 26/01/1991 26 y.o.  Admit date: 04/21/2016 Discharge date: 04/23/2016  Admission Diagnoses:  Discharge Diagnoses:  Active Problems:   Incompetent cervix in pregnancy, antepartum, second trimester   Incompetent cervix during second trimester, antepartum   Discharged Condition: good  Hospital Course: Pt admitted for observation with incompetent cervix and bleeding.  Ultrasound with no measurable cervix, cerclage in place.  Bleeding resolved, no significant cramping or ctx so she was stable for discharge home.  She has recently received betamethasone for fetal pulmonary maturation   Discharge Exam: Blood pressure 113/76, pulse 90, temperature 98.1 F (36.7 C), temperature source Oral, resp. rate 16, height 5\' 7"  (1.702 m), weight 99.8 kg (220 lb 1.9 oz), last menstrual period 10/30/2015, SpO2 97 %. General appearance: alert  Disposition: 01-Home or Self Care  Discharge Instructions    Discharge activity:    Complete by:  As directed    No strenuous activity, off feet as much as possible   Discharge diet:  No restrictions    Complete by:  As directed    Notify physician for a general feeling that "something is not right"    Complete by:  As directed    Notify physician for increase or change in vaginal discharge    Complete by:  As directed    Notify physician for intestinal cramps, with or without diarrhea, sometimes described as "gas pain"    Complete by:  As directed    Notify physician for leaking of fluid    Complete by:  As directed    Notify physician for low, dull backache, unrelieved by heat or Tylenol    Complete by:  As directed    Notify physician for menstrual like cramps    Complete by:  As directed    Notify physician for pelvic pressure    Complete by:  As directed    Notify physician for uterine contractions.  These may be painless and feel like the uterus is tightening or  the baby is  "balling up"    Complete by:  As directed    Notify physician for vaginal bleeding    Complete by:  As directed    PRETERM LABOR:  Includes any of the follwing symptoms that occur between 20 - [redacted] weeks gestation.  If these symptoms are not stopped, preterm labor can result in preterm delivery, placing your baby at risk    Complete by:  As directed    Sexual Activity:      Complete by:  As directed    No intercourse       Medication List    TAKE these medications   prenatal multivitamin Tabs tablet Take 1 tablet by mouth at bedtime.   progesterone 200 MG capsule Commonly known as:  PROMETRIUM Place 1 capsule (200 mg total) vaginally at bedtime.      Follow-up Information    Oliver PilaICHARDSON,KATHY W, MD .   Specialty:  Obstetrics and Gynecology Why:  as scheduled Contact information: [address] [place] [address] [phone]           Signed: Atwood Adcock D 04/23/2016, 9:07 AM

## 2016-04-23 NOTE — Progress Notes (Signed)
HD#3, [redacted]W[redacted]D, incompetent cervix No problems, no bleeding, no cramping Afeb, VSS FHT appropriate/reactive for 25 weeks D/c home

## 2016-04-24 ENCOUNTER — Encounter: Payer: Self-pay | Admitting: Advanced Practice Midwife

## 2016-04-24 DIAGNOSIS — O99820 Streptococcus B carrier state complicating pregnancy: Secondary | ICD-10-CM | POA: Insufficient documentation

## 2016-04-29 ENCOUNTER — Inpatient Hospital Stay (HOSPITAL_COMMUNITY)
Admission: AD | Admit: 2016-04-29 | Discharge: 2016-05-02 | DRG: 981 | Disposition: A | Discharge status: Home / Self Care | Payer: Managed Care, Other (non HMO) | Source: Ambulatory Visit | Attending: Obstetrics and Gynecology | Admitting: Obstetrics and Gynecology

## 2016-04-29 ENCOUNTER — Encounter (HOSPITAL_COMMUNITY): Payer: Self-pay

## 2016-04-29 ENCOUNTER — Inpatient Hospital Stay (HOSPITAL_COMMUNITY): Payer: Managed Care, Other (non HMO)

## 2016-04-29 DIAGNOSIS — Z87891 Personal history of nicotine dependence: Secondary | ICD-10-CM | POA: Diagnosis not present

## 2016-04-29 DIAGNOSIS — O99824 Streptococcus B carrier state complicating childbirth: Secondary | ICD-10-CM | POA: Diagnosis present

## 2016-04-29 DIAGNOSIS — Z3A26 26 weeks gestation of pregnancy: Secondary | ICD-10-CM | POA: Diagnosis not present

## 2016-04-29 DIAGNOSIS — O4692 Antepartum hemorrhage, unspecified, second trimester: Secondary | ICD-10-CM | POA: Diagnosis present

## 2016-04-29 DIAGNOSIS — Z833 Family history of diabetes mellitus: Secondary | ICD-10-CM | POA: Diagnosis not present

## 2016-04-29 DIAGNOSIS — O3432 Maternal care for cervical incompetence, second trimester: Secondary | ICD-10-CM | POA: Diagnosis present

## 2016-04-29 DIAGNOSIS — Z8249 Family history of ischemic heart disease and other diseases of the circulatory system: Secondary | ICD-10-CM | POA: Diagnosis not present

## 2016-04-29 DIAGNOSIS — O4690 Antepartum hemorrhage, unspecified, unspecified trimester: Secondary | ICD-10-CM

## 2016-04-29 DIAGNOSIS — O99820 Streptococcus B carrier state complicating pregnancy: Secondary | ICD-10-CM

## 2016-04-29 LAB — URINE MICROSCOPIC-ADD ON

## 2016-04-29 LAB — URINALYSIS, ROUTINE W REFLEX MICROSCOPIC
Bilirubin Urine: NEGATIVE
Glucose, UA: NEGATIVE mg/dL
LEUKOCYTES UA: NEGATIVE
NITRITE: NEGATIVE
PROTEIN: NEGATIVE mg/dL
Specific Gravity, Urine: >1.03 — ABNORMAL HIGH (ref 1.005–1.030)
pH: 6 (ref 5.0–8.0)

## 2016-04-29 LAB — CBC
HEMATOCRIT: 32.5 % — AB (ref 36.0–46.0)
Hemoglobin: 11 g/dL — ABNORMAL LOW (ref 12.0–15.0)
MCH: 28.6 pg (ref 26.0–34.0)
MCHC: 33.8 g/dL (ref 30.0–36.0)
MCV: 84.6 fL (ref 78.0–100.0)
PLATELETS: 221 10*3/uL (ref 150–400)
RBC: 3.84 MIL/uL — ABNORMAL LOW (ref 3.87–5.11)
RDW: 13.8 % (ref 11.5–15.5)
WBC: 10.4 10*3/uL (ref 4.0–10.5)

## 2016-04-29 LAB — TYPE AND SCREEN
ABO/RH(D): O POS
ANTIBODY SCREEN: NEGATIVE

## 2016-04-29 MED ORDER — CALCIUM CARBONATE ANTACID 500 MG PO CHEW: 2.0000 | CHEWABLE_TABLET | ORAL | Status: DC | PRN

## 2016-04-29 MED ORDER — PROGESTERONE MICRONIZED 200 MG PO CAPS
200.0000 mg | ORAL_CAPSULE | Freq: Every day | ORAL | Status: DC
  Administered 2016-04-29 – 2016-05-01 (×3): 200 mg via VAGINAL
  Filled 2016-04-29 (×4): qty 1

## 2016-04-29 MED ORDER — DOCUSATE SODIUM 100 MG PO CAPS
100.0000 mg | ORAL_CAPSULE | Freq: Every day | ORAL | Status: DC
  Administered 2016-05-01 – 2016-05-02 (×2): 100 mg via ORAL
  Filled 2016-04-29 (×4): qty 1

## 2016-04-29 MED ORDER — LACTATED RINGERS IV BOLUS (SEPSIS)
500.0000 mL | Freq: Once | INTRAVENOUS | Status: AC
  Administered 2016-04-29: 500 mL via INTRAVENOUS

## 2016-04-29 MED ORDER — SODIUM CHLORIDE 0.9% FLUSH: 3.0000 mL | INTRAVENOUS | Status: DC | PRN

## 2016-04-29 MED ORDER — PROGESTERONE 200 MG VA SUPP
200.0000 mg | Freq: Every day | VAGINAL | Status: DC
  Filled 2016-04-29: qty 1

## 2016-04-29 MED ORDER — PROGESTERONE MICRONIZED 200 MG PO CAPS: 200.0000 mg | ORAL_CAPSULE | Freq: Every day | ORAL | Status: DC

## 2016-04-29 MED ORDER — SODIUM CHLORIDE 0.9% FLUSH
3.0000 mL | Freq: Two times a day (BID) | INTRAVENOUS | Status: DC
  Administered 2016-05-02: 3 mL via INTRAVENOUS

## 2016-04-29 MED ORDER — SODIUM CHLORIDE 0.9 % IV SOLN
250.0000 mL | INTRAVENOUS | Status: DC | PRN
Start: 2016-04-29 — End: 2016-05-02

## 2016-04-29 MED ORDER — PRENATAL MULTIVITAMIN CH
1.0000 | ORAL_TABLET | Freq: Every day | ORAL | Status: DC
  Administered 2016-04-29 – 2016-05-01 (×3): 1 via ORAL
  Filled 2016-04-29 (×3): qty 1

## 2016-04-29 MED ORDER — ACETAMINOPHEN 325 MG PO TABS: 650.0000 mg | ORAL_TABLET | ORAL | Status: DC | PRN

## 2016-04-29 NOTE — MAU Note (Signed)
Patient presents with bleeding bright red in nature running down her legs since early this morning, feeling pressure, denies contractions, patient has cervical cerclage.

## 2016-04-29 NOTE — H&P (Signed)
Natalie Dunn is a 25 y.o. female presenting for vaginal bleeding. Pt has a hx of 27week delivery with prior pregnancy and rescue cerclage. She got a prophylactic cerclage this time at 15weeks. 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 When woke up today noted bleeding down her leg hence came to MAU. Pelvic US showed cervix closed but with funneling to cerclage. 1.3cm cervical length noted (neglibible last week). Cerclage intact. No contractions but blood noted in vault. Viable iup noted.   OB History    Gravida Para Term Preterm AB Living   5 1 0 1 3 1    SAB TAB Ectopic Multiple Live Births   2 0 0 0 1     Past Medical History:  Diagnosis Date  . SVD (spontaneous vaginal delivery) 11/2012   x 1   Past Surgical History:  Procedure Laterality Date  . CERVICAL CERCLAGE N/A 09/19/2012   Procedure: CERCLAGE CERVICAL;  Surgeon: Oliver PilaKathy W Richardson, MD;  Location: [facility];  Service: Gynecology;  Laterality: N/A;  . CERVICAL CERCLAGE N/A 02/08/2016   Procedure: CERCLAGE CERVICAL;  Surgeon: Huel CoteKathy Richardson, MD;  Location: [facility];  Service: Gynecology;  Laterality: N/A;   Family History: family history includes Diabetes in her father and mother; Hypertension in her father and mother. Social History:  reports that she has quit smoking. She has never used smokeless tobacco. She reports that she does not drink alcohol or use drugs.     Maternal Diabetes: No ( not done yet) Genetic Screening: Normal Maternal Ultrasounds/Referrals: Normal Fetal Ultrasounds or other Referrals:  None Maternal Substance Abuse:  No Significant Maternal Medications:  Meds include: Other: 17-P ; vaginal progesterone Significant Maternal Lab Results:  None Other Comments:  None  Review of Systems  Constitutional: Negative for chills, fever and weight loss.   History   Blood pressure 112/73, pulse 84, temperature 98.6 F (37 C), temperature source Oral, resp. rate 16, height 5\' 7"  (1.702 m),  weight 220 lb (99.8 kg), last menstrual period 10/30/2015. Exam Physical Exam  Prenatal labs: ABO, Rh: --/--/O POS (10/12 2159) Antibody: NEG (10/12 2159) Rubella:   RPR:    HBsAg:    HIV:    GBS:     Assessment/Plan: [id number] at 4426 0/[redacted]wks gestation s/p cerclage due to incompetent cervix now with vaginal bleeding  Cerclage intact in place; no contractions S/P BMZ two weeks ago 17P weekly dose received 10/15 Continue vaginal progesterone qhs Continuous monitoring Regular diet tonight  Heplock iv  Rico Massar Worema Lia Vigilante 04/29/2016, 5:28 PM

## 2016-04-29 NOTE — MAU Note (Signed)
Pt refused IV without Lidocaine. She said last time she was stuck twice and then anesthesia was called and used Lidocaine. Called Anesthesia and informed pt that it may be a while until someone is able to get IV.

## 2016-04-29 NOTE — MAU Provider Note (Signed)
  History     CSN: [id number]  Arrival date and time: 04/29/16 1128   First Provider Initiated Contact with Patient 04/29/16 1145      Chief Complaint  Patient presents with  . Vaginal Bleeding   G5P0131 @26 .0 weeks here with VB. She reports bleeding started last night and was dark red. Bleeding increased today about 1 hr ago and became bright red and ran down thigh. She also reports increased vaginal pressure today. She denies ctx and cramping. She reports +FM. She is using vaginal Progesterone.    OB History    Gravida Para Term Preterm AB Living   5 1 0 1 3 1    SAB TAB Ectopic Multiple Live Births   2 0 0 0 1      Past Medical History:  Diagnosis Date  . SVD (spontaneous vaginal delivery) 11/2012   x 1    Past Surgical History:  Procedure Laterality Date  . CERVICAL CERCLAGE N/A 09/19/2012   Procedure: CERCLAGE CERVICAL;  Surgeon: Oliver PilaKathy W Richardson, MD;  Location: [facility];  Service: Gynecology;  Laterality: N/A;  . CERVICAL CERCLAGE N/A 02/08/2016   Procedure: CERCLAGE CERVICAL;  Surgeon: Huel CoteKathy Richardson, MD;  Location: [facility];  Service: Gynecology;  Laterality: N/A;    Family History  Problem Relation Age of Onset  . Diabetes Mother   . Hypertension Mother   . Diabetes Father   . Hypertension Father     Social History  Substance Use Topics  . Smoking status: Former Games developermoker  . Smokeless tobacco: Never Used  . Alcohol use No    Allergies: No Known Allergies  Prescriptions Prior to Admission  Medication Sig Dispense Refill Last Dose  . Prenatal Vit-Fe Fumarate-FA (PRENATAL MULTIVITAMIN) TABS tablet Take 1 tablet by mouth at bedtime.   04/20/2016 at Unknown time  . progesterone (PROMETRIUM) 200 MG capsule Place 1 capsule (200 mg total) vaginally at bedtime. 30 capsule 5     Review of Systems  Constitutional: Negative.   Gastrointestinal: Negative.   Genitourinary: Negative.    Physical Exam   Blood pressure 118/85, pulse 92, temperature 98 F (36.7  C), resp. rate 16, height 5\' 7"  (1.702 m), weight 99.8 kg (220 lb), last menstrual period 10/30/2015.  Physical Exam  Constitutional: She is oriented to person, place, and time. She appears well-developed and well-nourished.  HENT:  Head: Normocephalic and atraumatic.  Neck: Normal range of motion. Neck supple.  Cardiovascular: Normal rate.   Respiratory: Effort normal.  GI: Soft. She exhibits no distension. There is no tenderness.  gravid  Genitourinary:  Genitourinary Comments: Speculum: visually closed, cerclage present  Musculoskeletal: Normal range of motion.  Neurological: She is alert and oriented to person, place, and time.  Skin: Skin is warm and dry.  Psychiatric: She has a normal mood and affect.   EFM: 150 bpm, mod variability, + accels, no decels Toco: none  MAU Course  Procedures  MDM Presentation and clinical findings discussed with Dr. Mindi SlickerBanga. US ordered. Pt continues to have small amt bright red bleeding. US findings discussed with Dr. Mindi SlickerBanga, plan for admit.  Assessment and Plan  [redacted] weeks gestation Cervical incompetence in pregnancy Vaginal bleeding  Admit Management per MD  Donette LarryMelanie Jerelle Virden, CNM 04/29/2016, 12:04 PM

## 2016-04-30 ENCOUNTER — Encounter (HOSPITAL_COMMUNITY): Payer: Self-pay

## 2016-04-30 LAB — CBC
HEMATOCRIT: 30.2 % — AB (ref 36.0–46.0)
HEMOGLOBIN: 10.5 g/dL — AB (ref 12.0–15.0)
MCH: 29 pg (ref 26.0–34.0)
MCHC: 34.8 g/dL (ref 30.0–36.0)
MCV: 83.4 fL (ref 78.0–100.0)
Platelets: 215 10*3/uL (ref 150–400)
RBC: 3.62 MIL/uL — ABNORMAL LOW (ref 3.87–5.11)
RDW: 13.9 % (ref 11.5–15.5)
WBC: 8.1 10*3/uL (ref 4.0–10.5)

## 2016-04-30 LAB — RPR: RPR Ser Ql: NONREACTIVE

## 2016-04-30 MED ORDER — LACTATED RINGERS IV SOLN
INTRAVENOUS | Status: DC
  Administered 2016-04-30 – 2016-05-02 (×5): via INTRAVENOUS

## 2016-04-30 MED ORDER — MAGNESIUM SULFATE 50 % IJ SOLN
2.0000 g/h | INTRAVENOUS | Status: DC
  Administered 2016-04-30 (×2): 2 g/h via INTRAVENOUS
  Filled 2016-04-30 (×2): qty 80

## 2016-04-30 MED ORDER — BUTORPHANOL TARTRATE 1 MG/ML IJ SOLN: 1.0000 mg | INTRAMUSCULAR | Status: DC | PRN

## 2016-04-30 MED ORDER — ONDANSETRON HCL 4 MG/2ML IJ SOLN: 4.0000 mg | Freq: Four times a day (QID) | INTRAMUSCULAR | Status: DC | PRN

## 2016-04-30 MED ORDER — LACTATED RINGERS IV SOLN: 500.0000 mL | INTRAVENOUS | Status: DC | PRN

## 2016-04-30 MED ORDER — LIDOCAINE HCL (PF) 1 % IJ SOLN
30.0000 mL | INTRAMUSCULAR | Status: DC | PRN
  Filled 2016-04-30: qty 30

## 2016-04-30 MED ORDER — SODIUM CHLORIDE 0.9 % IV SOLN
2.0000 g | Freq: Once | INTRAVENOUS | Status: AC
  Administered 2016-04-30: 2 g via INTRAVENOUS
  Filled 2016-04-30: qty 2000

## 2016-04-30 MED ORDER — SOD CITRATE-CITRIC ACID 500-334 MG/5ML PO SOLN: 30.0000 mL | ORAL | Status: DC | PRN

## 2016-04-30 MED ORDER — OXYCODONE-ACETAMINOPHEN 5-325 MG PO TABS: 1.0000 | ORAL_TABLET | ORAL | Status: DC | PRN

## 2016-04-30 MED ORDER — MAGNESIUM SULFATE BOLUS VIA INFUSION
6.0000 g | Freq: Once | INTRAVENOUS | Status: AC
  Administered 2016-04-30: 6 g via INTRAVENOUS
  Filled 2016-04-30: qty 500

## 2016-04-30 MED ORDER — OXYTOCIN 40 UNITS IN LACTATED RINGERS INFUSION - SIMPLE MED: 2.5000 [IU]/h | INTRAVENOUS | Status: DC

## 2016-04-30 MED ORDER — SODIUM CHLORIDE 0.9 % IV SOLN
2.0000 g | Freq: Four times a day (QID) | INTRAVENOUS | Status: DC
  Administered 2016-04-30 – 2016-05-01 (×5): 2 g via INTRAVENOUS
  Filled 2016-04-30 (×5): qty 2000

## 2016-04-30 MED ORDER — OXYCODONE-ACETAMINOPHEN 5-325 MG PO TABS: 2.0000 | ORAL_TABLET | ORAL | Status: DC | PRN

## 2016-04-30 MED ORDER — ACETAMINOPHEN 325 MG PO TABS: 650.0000 mg | ORAL_TABLET | ORAL | Status: DC | PRN

## 2016-04-30 MED ORDER — OXYTOCIN BOLUS FROM INFUSION: 500.0000 mL | Freq: Once | INTRAVENOUS | Status: DC

## 2016-04-30 NOTE — Progress Notes (Signed)
Patient ID: Natalie DustKiara A Dunn, female   DOB: 03/14/1991, 25 y.o.   MRN: [id number]  Pt sleeping soundly; no apparent complaints VSS EFM- 145, moderate variability, cat 1 TOCO - no contractions SVE - deferred  A/P: [id number] at 26 1/7wks s/p removal of cerclage due            to bleeding from cervix - now stable          Continue on MgSO4 for CP prophylaxis x 24hrs          Continue on ampicillin           Reassess prn

## 2016-04-30 NOTE — Progress Notes (Signed)
Cerclage sucessfully clipped by Dr. Mindi SlickerBanga. Small amount of bleeding noted. No clots presenting at the present time

## 2016-04-30 NOTE — Progress Notes (Addendum)
Patient ID: [name], female   DOB: 01/17/1991, 25 y.o.   MRN: [id number] Pt had a BM at 2130 after which she begun having slightly more bright red bleeding. No contractions noted. About 15 mins ago pt had another BM and reported increased pressure and more bright red vaginal bleeding.  On arrival I performed a pelvic exam and cervix is 90% effaced, 1cm dilated Despite not having any active contractions it appears cervix is dilating and bleeding likely coming from strain of suture on cervix.  I called and informed neonatologist on call.  Staffing for NICU can accomodate a delivery at this ga thus will not plan on transfer to a different facility  EFM - 150s, moderate variability US- vertex per bedside US done at this time  [facility]      Neonatology and anesthesia notified      Will remove cerclage when stable in rm      LR, antibx, MgSO4 to start now      Neonatology consult obtained      Will plan for likely imminent delivery

## 2016-04-30 NOTE — Progress Notes (Signed)
Patient ID: [name], female   DOB: 06/11/1991, 25 y.o.   MRN: [id number] Pt still comfortable with no complaints Still has some bright red bleeding but less than before No contractions, fever or discharge. +Fms  VSS EFM-145, mod variability, cat 1 TOCO - no contractions SVE 1/80/-2 - unchanged  A/P: [id number] at 26 1/7wks with incompetent cervix and vaginal bleeding - s/p removal of cerclage, on MgSO4, amp, ivf      Will transfer back to antepartum from birthing suite      May have regular diet      Continue progesterone qhs      Next dose 17 P due on 05/02/16

## 2016-04-30 NOTE — Progress Notes (Signed)
Pt transferred to L&D room 169 via wheelchair. Report given to Fransisco BeauKristy Lashley, RNC.

## 2016-04-30 NOTE — Progress Notes (Signed)
Patient ID: Natalie Dunn, female   DOB: 05/23/1991, 25 y.o.   MRN: [id number] Pt stable in birthing suite Reports increased pelvic pressure Family members present  S/p neonatology consult IVF, amp and MgSO4 started Counseled on removal of sutures   Both sutures removed  Cervix still 1 cm and now 80% effaced  Will monitor and recheck prn

## 2016-05-01 MED ORDER — METRONIDAZOLE 500 MG PO TABS
500.0000 mg | ORAL_TABLET | Freq: Two times a day (BID) | ORAL | Status: DC
  Administered 2016-05-01 – 2016-05-02 (×3): 500 mg via ORAL
  Filled 2016-05-01 (×3): qty 1

## 2016-05-01 NOTE — Progress Notes (Signed)
Patient ID: Natalie Dunn, female   DOB: 06/12/1991, 25 y.o.   MRN: [id number] Pt feels tired but well. Had insomnia last night. She denies any contractions, pelvic pain or discomfort. Bleeding is significantly less than than before - just spotting mostly. +Fms.  VSS; afeb EFM- 140s, moderate variability TOCO - no contractions SVE - deferred  A/P: [id number] at 5526 2/[redacted]wks gestational age with incompetent cervix Improved vaginal bleeding after removal of cerclage on 10/21 S/p 24hrs of MgSO4 for CP prophylaxis S/p BMZ course at 24-[redacted]weeks ga S/p Neonatology consult Ampicillin d/c for GBS treatment after cerclage removed Will start flagyl ( late start) for reduction of risk of vaginal infection after cerclage removal  Next dose 17 P due 10/23; continue progesterone pv qhs Consider FFN if no bleeding/cervical checks for 24-48 hours  Change to intermittent monitoring Discussed possible discharge to home on bedrest vs prolonged hospitalization based on sx over next 24hrs off MgSO4 All questions answered

## 2016-05-02 LAB — TYPE AND SCREEN
ABO/RH(D): O POS
Antibody Screen: NEGATIVE

## 2016-05-02 LAB — FETAL FIBRONECTIN: FETAL FIBRONECTIN: POSITIVE — AB

## 2016-05-02 MED ORDER — METRONIDAZOLE 500 MG PO TABS: 500.0000 mg | ORAL_TABLET | Freq: Two times a day (BID) | ORAL | 0 refills | Status: DC

## 2016-05-02 NOTE — Discharge Summary (Signed)
OB Discharge Summary     Patient Name: Natalie Dunn DOB: 03/30/1991 MRN: [id number]  Date of admission: 04/29/2016 Delivering MD:     Date of discharge: 05/02/2016  Admitting diagnosis: 26WKS,BLEEDING Intrauterine pregnancy: 754w3d     Secondary diagnosis:  Active Problems:   Cervical incompetence during pregnancy in second trimester   Preterm labor in second trimester Vaginal bleeding with cerclage removal x 2      Discharge diagnosis: Preterm labor and vaginal bleeding     S/p cerclage removal  GBS positive                                                                                            Hospital course:  Pt was admitted with moderate vaginal bleeding.  She was having some contractions and cervix dilated to 1 cm with bleeding apparently coming from strain on sutures, so these were removed.  Pt was given Magnesium for CP prophylaxis with possible impending delivery but contractions stopped and cervix did not dilate further.  She was observed another day and no bleeding noted in >24 hours.  Cervix remained unchanged and FFN collected.   US confirmed still vertex.  Since pt lives 10 min from hospital, she would like to go home to continue her bedrest.  We discussed this is reasonable.  Will d/c her to home and have her come by the [facility]  Physical exam  Vitals:   05/01/16 1928 05/01/16 2138 05/02/16 0109 05/02/16 0905  BP: 121/75 126/79 114/75 126/76  Pulse: 92 82 76 89  Resp:  18 18 16   Temp: 98.2 F (36.8 C) 98.3 F (36.8 C) 98.2 F (36.8 C) 98.5 F (36.9 C)  TempSrc: Oral Oral Oral Oral  SpO2:      Weight:      Height:       General: alert and cooperative Abdomen NT  Cervix 80/1/-3 presenting part not felt  Labs: Lab Results  Component Value Date   WBC 8.1 04/30/2016   HGB 10.5 (L) 04/30/2016   HCT 30.2 (L) 04/30/2016   MCV 83.4 04/30/2016   PLT 215 04/30/2016   CMP Latest Ref Rng & Units 11/12/2012  Glucose 70 - 99 mg/dL  409(W106(H)  BUN 6 - 23 mg/dL 8  Creatinine 1.190.50 - 1.471.10 mg/dL 8.290.65  Sodium 562135 - 130145 mEq/L 134(L)  Potassium 3.5 - 5.1 mEq/L 3.4(L)  Chloride 96 - 112 mEq/L 103  CO2 19 - 32 mEq/L 22  Calcium 8.4 - 10.5 mg/dL 9.4  Total Protein 6.0 - 8.3 g/dL 6.0  Total Bilirubin 0.3 - 1.2 mg/dL 8.6(V0.2(L)  Alkaline Phos 39 - 117 U/L 53  AST 0 - 37 U/L 17  ALT 0 - 35 U/L 18      After visit meds:    Medication List    TAKE these medications   metroNIDAZOLE 500 MG tablet Commonly known as:  FLAGYL Take 1 tablet (500 mg total) by mouth every 12 (twelve) hours.   prenatal multivitamin Tabs tablet Take 1 tablet by mouth at bedtime.   progesterone 200 MG capsule Commonly known as:  PROMETRIUM Place 1 capsule (200 mg total) vaginally at bedtime.       Diet: routine diet        05/02/2016 Oliver Pila, MD

## 2016-05-02 NOTE — Progress Notes (Signed)
Discharge instructions reviewed with patient. Patient verbalized an understanding of instructions. Discharged via wheelchair with family member.

## 2016-05-02 NOTE — Discharge Instructions (Signed)
Preterm Labor Information °Preterm labor is when labor starts before you are [redacted] weeks pregnant. The normal length of pregnancy is 39 to 41 weeks.  °CAUSES  °The cause of preterm labor is not often known. The most common known cause is infection. °RISK FACTORS °· Having a history of preterm labor. °· Having your water break before it should. °· Having a placenta that covers the opening of the cervix. °· Having a placenta that breaks away from the uterus. °· Having a cervix that is too weak to hold the baby in the uterus. °· Having too much fluid in the amniotic sac. °· Taking drugs or smoking while pregnant. °· Not gaining enough weight while pregnant. °· Being younger than 18 and older than 26 years old. °· Having a low income. °· Being African American. °SYMPTOMS °· Period-like cramps, belly (abdominal) pain, or back pain. °· Contractions that are regular, as often as six in an hour. They may be mild or painful. °· Contractions that start at the top of the belly. They then move to the lower belly and back. °· Lower belly pressure that seems to get stronger. °· Bleeding from the vagina. °· Fluid leaking from the vagina. °TREATMENT  °Treatment depends on: °· Your condition. °· The condition of your baby. °· How many weeks pregnant you are. °Your doctor may have you: °· Take medicine to stop contractions. °· Stay in bed except to use the restroom (bed rest). °· Stay in the hospital. °WHAT SHOULD YOU DO IF YOU THINK YOU ARE IN PRETERM LABOR? °Call your doctor right away. You need to go to the hospital right away.  °HOW CAN YOU PREVENT PRETERM LABOR IN FUTURE PREGNANCIES? °· Stop smoking, if you smoke. °· Maintain healthy weight gain. °· Do not take drugs or be around chemicals that are not needed. °· Tell your doctor if you think you have an infection. °· Tell your doctor if you had a preterm labor before. °  °This information is not intended to replace advice given to you by your health care provider. Make sure you  discuss any questions you have with your health care provider. °  °Document Released: 09/23/2008 Document Revised: 11/11/2014 Document Reviewed: 07/30/2012 °Elsevier Interactive Patient Education ©2016 Else[employer] Inc. ° °

## 2016-05-02 NOTE — Progress Notes (Signed)
Patient ID: Natalie Dunn, female   DOB: 06/20/1991, 25 y.o.   MRN: [id number]  26 3/7 weeks incompetent cervix, VB and now s/p cerclage removal Pt reports a good last 24 hours with minimal bleeding.  No significant bleeding since cerclage removed.  Good FM Denies contractions  afeb vss FHR reassuring  Cervix 80/1/-3  No presenting part felt but US confirms still vertex  FFN collected prior to exam  D/w pt appears things have stabilized with no bleeding in 24 hours and none heavy since cerclage removal.  Will check FFN result and consider letting pt go home this PM.  She lives 10 min from hospital and feels confident she can get here quickly if needed.  She is due Makena and will come by office after d/c to receive

## 2016-05-11 ENCOUNTER — Ambulatory Visit (INDEPENDENT_AMBULATORY_CARE_PROVIDER_SITE_OTHER): Payer: Managed Care, Other (non HMO) | Admitting: *Deleted

## 2016-05-11 VITALS — BP 124/72 | HR 85

## 2016-05-11 DIAGNOSIS — O3432 Maternal care for cervical incompetence, second trimester: Secondary | ICD-10-CM | POA: Diagnosis not present

## 2016-05-11 DIAGNOSIS — O09212 Supervision of pregnancy with history of pre-term labor, second trimester: Secondary | ICD-10-CM | POA: Diagnosis not present

## 2016-05-11 DIAGNOSIS — O09892 Supervision of other high risk pregnancies, second trimester: Secondary | ICD-10-CM

## 2016-05-11 MED ORDER — BETAMETHASONE SOD PHOS & ACET 6 (3-3) MG/ML IJ SUSP
12.0000 mg | Freq: Once | INTRAMUSCULAR | Status: AC
  Administered 2016-05-11: 12 mg via INTRAMUSCULAR

## 2016-05-11 NOTE — Progress Notes (Signed)
Pt presented from Dr. Berenda Moraleichardson's office for  Betamethasone injection.   Injection administered and tolerated well.  Next injection due 11/2 @ 1300.

## 2016-05-12 ENCOUNTER — Ambulatory Visit (INDEPENDENT_AMBULATORY_CARE_PROVIDER_SITE_OTHER): Payer: Managed Care, Other (non HMO) | Admitting: *Deleted

## 2016-05-12 DIAGNOSIS — O3432 Maternal care for cervical incompetence, second trimester: Secondary | ICD-10-CM

## 2016-05-12 MED ORDER — BETAMETHASONE SOD PHOS & ACET 6 (3-3) MG/ML IJ SUSP
12.0000 mg | Freq: Once | INTRAMUSCULAR | Status: AC
  Administered 2016-05-12: 12 mg via INTRAMUSCULAR

## 2016-05-16 ENCOUNTER — Encounter: Payer: Self-pay | Admitting: *Deleted

## 2016-05-21 ENCOUNTER — Encounter (HOSPITAL_COMMUNITY): Payer: Self-pay | Admitting: Anesthesiology

## 2016-05-21 ENCOUNTER — Inpatient Hospital Stay (HOSPITAL_COMMUNITY)
Admission: AD | Admit: 2016-05-21 | Discharge: 2016-06-27 | DRG: 775 | Disposition: A | Discharge status: Home / Self Care | Payer: Medicaid Other | Source: Ambulatory Visit | Attending: Obstetrics and Gynecology | Admitting: Obstetrics and Gynecology

## 2016-05-21 ENCOUNTER — Encounter (HOSPITAL_COMMUNITY): Payer: Self-pay | Admitting: Certified Nurse Midwife

## 2016-05-21 DIAGNOSIS — O3433 Maternal care for cervical incompetence, third trimester: Secondary | ICD-10-CM | POA: Diagnosis present

## 2016-05-21 DIAGNOSIS — Z833 Family history of diabetes mellitus: Secondary | ICD-10-CM

## 2016-05-21 DIAGNOSIS — Z87891 Personal history of nicotine dependence: Secondary | ICD-10-CM

## 2016-05-21 DIAGNOSIS — Z8249 Family history of ischemic heart disease and other diseases of the circulatory system: Secondary | ICD-10-CM | POA: Diagnosis not present

## 2016-05-21 DIAGNOSIS — O4290 Premature rupture of membranes, unspecified as to length of time between rupture and onset of labor, unspecified weeks of gestation: Secondary | ICD-10-CM

## 2016-05-21 DIAGNOSIS — O99820 Streptococcus B carrier state complicating pregnancy: Secondary | ICD-10-CM

## 2016-05-21 DIAGNOSIS — O42913 Preterm premature rupture of membranes, unspecified as to length of time between rupture and onset of labor, third trimester: Principal | ICD-10-CM | POA: Diagnosis present

## 2016-05-21 DIAGNOSIS — Z3A29 29 weeks gestation of pregnancy: Secondary | ICD-10-CM | POA: Diagnosis not present

## 2016-05-21 DIAGNOSIS — O42919 Preterm premature rupture of membranes, unspecified as to length of time between rupture and onset of labor, unspecified trimester: Secondary | ICD-10-CM | POA: Diagnosis present

## 2016-05-21 DIAGNOSIS — O99824 Streptococcus B carrier state complicating childbirth: Secondary | ICD-10-CM | POA: Diagnosis present

## 2016-05-21 DIAGNOSIS — Z3A31 31 weeks gestation of pregnancy: Secondary | ICD-10-CM

## 2016-05-21 DIAGNOSIS — O365930 Maternal care for other known or suspected poor fetal growth, third trimester, not applicable or unspecified: Secondary | ICD-10-CM

## 2016-05-21 DIAGNOSIS — O09899 Supervision of other high risk pregnancies, unspecified trimester: Secondary | ICD-10-CM

## 2016-05-21 DIAGNOSIS — O09219 Supervision of pregnancy with history of pre-term labor, unspecified trimester: Secondary | ICD-10-CM

## 2016-05-21 LAB — CBC
HCT: 33.8 % — ABNORMAL LOW (ref 36.0–46.0)
Hemoglobin: 11.6 g/dL — ABNORMAL LOW (ref 12.0–15.0)
MCH: 28.7 pg (ref 26.0–34.0)
MCHC: 34.3 g/dL (ref 30.0–36.0)
MCV: 83.7 fL (ref 78.0–100.0)
PLATELETS: 233 10*3/uL (ref 150–400)
RBC: 4.04 MIL/uL (ref 3.87–5.11)
RDW: 13.9 % (ref 11.5–15.5)
WBC: 10.4 10*3/uL (ref 4.0–10.5)

## 2016-05-21 LAB — TYPE AND SCREEN
ABO/RH(D): O POS
ANTIBODY SCREEN: NEGATIVE

## 2016-05-21 LAB — AMNISURE RUPTURE OF MEMBRANE (ROM) NOT AT ARMC: Amnisure ROM: POSITIVE

## 2016-05-21 MED ORDER — ZOLPIDEM TARTRATE 5 MG PO TABS
5.0000 mg | ORAL_TABLET | Freq: Every evening | ORAL | Status: DC | PRN
Start: 2016-05-21 — End: 2016-06-26
  Filled 2016-05-21: qty 1

## 2016-05-21 MED ORDER — AMOXICILLIN 500 MG PO CAPS
500.0000 mg | ORAL_CAPSULE | Freq: Three times a day (TID) | ORAL | Status: AC
Start: 2016-05-23 — End: 2016-05-28
  Administered 2016-05-23 – 2016-05-28 (×15): 500 mg via ORAL
  Filled 2016-05-21 (×15): qty 1

## 2016-05-21 MED ORDER — LACTATED RINGERS IV SOLN
INTRAVENOUS | Status: DC
  Administered 2016-05-21 – 2016-05-22 (×2): via INTRAVENOUS

## 2016-05-21 MED ORDER — PRENATAL MULTIVITAMIN CH
1.0000 | ORAL_TABLET | Freq: Every day | ORAL | Status: DC
  Administered 2016-05-22 – 2016-06-24 (×33): 1 via ORAL
  Filled 2016-05-21 (×33): qty 1

## 2016-05-21 MED ORDER — AZITHROMYCIN 250 MG PO TABS
500.0000 mg | ORAL_TABLET | Freq: Every day | ORAL | Status: AC
  Administered 2016-05-21 – 2016-05-27 (×7): 500 mg via ORAL
  Filled 2016-05-21 (×7): qty 2

## 2016-05-21 MED ORDER — ACETAMINOPHEN 325 MG PO TABS
650.0000 mg | ORAL_TABLET | ORAL | Status: DC | PRN
  Filled 2016-05-21: qty 2

## 2016-05-21 MED ORDER — DOCUSATE SODIUM 100 MG PO CAPS
100.0000 mg | ORAL_CAPSULE | Freq: Every day | ORAL | Status: DC
  Administered 2016-05-22 – 2016-06-24 (×32): 100 mg via ORAL
  Filled 2016-05-21 (×34): qty 1

## 2016-05-21 MED ORDER — SODIUM CHLORIDE 0.9 % IV SOLN
2.0000 g | Freq: Four times a day (QID) | INTRAVENOUS | Status: AC
  Administered 2016-05-21 – 2016-05-23 (×8): 2 g via INTRAVENOUS
  Filled 2016-05-21 (×9): qty 2000

## 2016-05-21 MED ORDER — CALCIUM CARBONATE ANTACID 500 MG PO CHEW
2.0000 | CHEWABLE_TABLET | ORAL | Status: DC | PRN
  Filled 2016-05-21: qty 2

## 2016-05-21 NOTE — MAU Note (Signed)
Pt states her water broke this AM. Pt has a hx of a 27 week birth. Pt states she cerclage was clipped out a few weeks ago as it was tearing through her cervix. Pt denies vaginal bleeding. Fetus is active.

## 2016-05-21 NOTE — MAU Provider Note (Signed)
Chief Complaint:  Rupture of Membranes    HPI: Natalie Dunn is a 25 y.o. [id number] at [redacted]w[redacted]d who presents to maternity admissions reporting leaking of fluid.  Patient has known incompetent cervix, had a cerclage placed, but needed to be removed due to dilation of cervix and the cerclage strings were on tension (removed 10/21). Felt leaking fluid at 10AM this morning, and has been continually leaking since, it has been clear. She has not felt contractions. She denies anything per vagina since beginning of pregnancy (no sex, fingers aside from SVE in office this week, she was 3.5cm dilated). She has been receiving Makena injections during the pregnancy course. She has received 2 courses of betamethasone shots. She has a history of a preterm baby at 55 weeks who is currently living.  Denies contractions or vaginal bleeding. Good fetal movement.    Past Medical History: Past Medical History:  Diagnosis Date  . SVD (spontaneous vaginal delivery) 11/2012   x 1    Past obstetric history: OB History  Gravida Para Term Preterm AB Living  5 1 0 1 3 1   SAB TAB Ectopic Multiple Live Births  2 0 0 0 1    # Outcome Date GA Lbr Len/2nd Weight Sex Delivery Anes PTL Lv  5 Current           4 AB 02/08/13          3 Preterm 11/14/12 [redacted]w[redacted]d 07:10 / 00:15 2 lb 5 oz (1.05 kg) M Vag-Spont EPI  LIV  2 SAB           1 SAB               Past Surgical History: Past Surgical History:  Procedure Laterality Date  . CERVICAL CERCLAGE N/A 09/19/2012   Procedure: CERCLAGE CERVICAL;  Surgeon: Oliver Pila, MD;  Location: [facility];  Service: Gynecology;  Laterality: N/A;  . CERVICAL CERCLAGE N/A 02/08/2016   Procedure: CERCLAGE CERVICAL;  Surgeon: Huel Cote, MD;  Location: [facility];  Service: Gynecology;  Laterality: N/A;     Family History: Family History  Problem Relation Age of Onset  . Diabetes Mother   . Hypertension Mother   . Diabetes Father   . Hypertension Father     Social  History: Social History  Substance Use Topics  . Smoking status: Former Games developer  . Smokeless tobacco: Never Used  . Alcohol use No    Allergies: No Known Allergies  Meds:  Prescriptions Prior to Admission  Medication Sig Dispense Refill Last Dose  . metroNIDAZOLE (FLAGYL) 500 MG tablet Take 1 tablet (500 mg total) by mouth every 12 (twelve) hours. 12 tablet 0   . Prenatal Vit-Fe Fumarate-FA (PRENATAL MULTIVITAMIN) TABS tablet Take 1 tablet by mouth at bedtime.   Past Week at Unknown time  . progesterone (PROMETRIUM) 200 MG capsule Place 1 capsule (200 mg total) vaginally at bedtime. 30 capsule 5 Past Week at Unknown time    I have reviewed patient's Past Medical Hx, Surgical Hx, Family Hx, Social Hx, medications and allergies.   ROS:  A comprehensive ROS was negative except per HPI.    Physical Exam  Patient Vitals for the past 24 hrs:  BP Temp Temp src Pulse Resp  05/21/16 1041 127/81 98.1 F (36.7 C) Oral 88 18   Constitutional: Well-developed, well-nourished female in no acute distress.  Cardiovascular: normal rate Respiratory: normal effort GI: Abd soft, non-tender, gravid appropriate for gestational age. Pos BS x 4 MS:  Extremities nontender, no edema, normal ROM Neurologic: Alert and oriented x 4.  GU: Neg CVAT.  SSE: +Pooling, visually 4-5cm, hair/fetal head visualized. Wet mount: Positive for ferning. DID NOT PERFORM SVE DUE TO PRETERM ROM.   FHT:  Baseline 150 , moderate variability, accelerations present, no decelerations Contractions: None   Labs: Results for orders placed or performed during the hospital encounter of 05/21/16 (from the past 24 hour(s))  Amnisure rupture of membrane (rom)not at [facility]     Status: None   Collection Time: 05/21/16 10:53 AM  Result Value Ref Range   Amnisure ROM POSITIVE     Imaging:  [place] Mfm Ob Transvaginal  Result Date: 04/29/2016 OBSTETRICAL ULTRASOUND: This exam was performed within a [place] Ultrasound Department.  The OB US report was generated in the AS system, and faxed to the ordering physician.  This report is available in the [employer] PACS. See the AS Obstetric US report via the Image Link.  [place] Mfm Ob Transvaginal  Result Date: 04/22/2016 OBSTETRICAL ULTRASOUND: This exam was performed within a [place] Ultrasound Department. The OB US report was generated in the AS system, and faxed to the ordering physician.  This report is available in the [employer] PACS. See the AS Obstetric US report via the Image Link.  [place] Mfm Ob Transvaginal  Result Date: 04/22/2016 OBSTETRICAL ULTRASOUND: This exam was performed within a [place] Ultrasound Department. The OB US report was generated in the AS system, and faxed to the ordering physician.  This report is available in the [employer] PACS. See the AS Obstetric US report via the Image Link.  [place] Mfm Ob Limited  Result Date: 04/29/2016 OBSTETRICAL ULTRASOUND: This exam was performed within a [place] Ultrasound Department. The OB US report was generated in the AS system, and faxed to the ordering physician.  This report is available in the [employer] PACS. See the AS Obstetric US report via the Image Link.  [place] Mfm Ob Limited  Result Date: 04/22/2016 OBSTETRICAL ULTRASOUND: This exam was performed within a [place] Ultrasound Department. The OB US report was generated in the AS system, and faxed to the ordering physician.  This report is available in the [employer] PACS. See the AS Obstetric US report via the Image Link.  [place] Mfm Ob Limited  Result Date: 04/22/2016 OBSTETRICAL ULTRASOUND: This exam was performed within a [place] Ultrasound Department. The OB US report was generated in the AS system, and faxed to the ordering physician.  This report is available in the [employer] PACS. See the AS Obstetric US report via the Image Link.   MAU Course: SSE- positive for grossly ROM; visualized cervix 4-5cm with hair seen and fetal head.  Amniosure sent  11:10 AM -  Dr. Jackelyn KnifeMeisinger called, aware and will put orders in, patient to go to L&D.  11:15 AM - Called NNT to inform of patient and baby, informed she is GBS Positive and she has received 2 courses of BMZ. NICU accepts patient if delivers.  MDM: Plan of care reviewed with patient, including labs and tests ordered and medical treatment. Discussed admission status with patient.  I personally reviewed the patient's NST today, found to be REACTIVE. 150 bpm, mod var, +accels, no decels. CTX: None.   Assessment: 1. Preterm premature rupture of membranes, unspecified duration to onset of labor     Plan: Admitting to L&D for care Latency antibiotics to be started (including GBS prophylaxis) Care per Dr. Jackelyn KnifeMeisinger, please see H&P.  Jen MowElizabeth Mumaw, DO OB Fellow 05/21/2016 11:15 AM

## 2016-05-21 NOTE — Anesthesia Pain Management Evaluation Note (Signed)
  CRNA Pain Management Visit Note  Patient: Natalie Dunn, 25 y.o., female  "Hello [facility] am a [facility] of the [facility]. We have an anesthesia team available at all times to provide care throughout the hospital, including epidural management and anesthesia for C-section. I don't know your plan for the delivery whether it a natural birth, water birth, IV sedation, nitrous supplementation, doula or epidural, but we want to meet your pain goals."   1.Was your pain managed to your expectations on prior hospitalizations?   Yes   2.What is your expectation for pain management during this hospitalization?     The patient is undecided at this point in time. She is considering an epidural.  3.How can we help you reach that goal? Be available.  Record the patient's initial score and the patient's pain goal.   Pain: 0  Pain Goal: 6 [facility] wants you to be able to say your pain was always managed very well.  [name] 05/21/2016

## 2016-05-22 LAB — RPR: RPR: NONREACTIVE

## 2016-05-22 NOTE — H&P (Signed)
Natalie DustKiara A Dunn is a 25 y.o. female, G5 [id number], EGA [redacted]W[redacted]D with EDC 1-26 presenting for ROM.  Started leaking fluid yesterday, eval in MAU confirmed ROM, visually 3-4 cm dilated.  Pt admitted and put on antibiotics, previously received steroids.  Prenatal care complicated by h/o incompetent cervix, had cerclage placed which was eventually removed about 2 weeks ago due to thin cervix and bleeding.  OB History    Gravida Para Term Preterm AB Living   5 1 0 1 3 1    SAB TAB Ectopic Multiple Live Births   2 0 0 0 1     Past Medical History:  Diagnosis Date  . SVD (spontaneous vaginal delivery) 11/2012   x 1   Past Surgical History:  Procedure Laterality Date  . CERVICAL CERCLAGE N/A 09/19/2012   Procedure: CERCLAGE CERVICAL;  Surgeon: Oliver PilaKathy W Richardson, MD;  Location: [facility];  Service: Gynecology;  Laterality: N/A;  . CERVICAL CERCLAGE N/A 02/08/2016   Procedure: CERCLAGE CERVICAL;  Surgeon: Huel CoteKathy Richardson, MD;  Location: [facility];  Service: Gynecology;  Laterality: N/A;   Family History: family history includes Diabetes in her father and mother; Hypertension in her father and mother. Social History:  reports that she has quit smoking. She has never used smokeless tobacco. She reports that she does not drink alcohol or use drugs.     Maternal Diabetes: No Genetic Screening: Normal Maternal Ultrasounds/Referrals: Normal Fetal Ultrasounds or other Referrals:  None Maternal Substance Abuse:  No Significant Maternal Medications:  Meds include: Progesterone Significant Maternal Lab Results:  Lab values include: Group B Strep positive Other Comments:  PPROM at 29 weeks  Review of Systems  Respiratory: Negative.   Cardiovascular: Negative.    Maternal Medical History:  Reason for admission: Rupture of membranes.   Fetal activity: Perceived fetal activity is normal.      Dilation: 4.5 (visually) Exam by:: Dr. Omer JackMumaw Blood pressure 114/67, pulse 95, temperature 97.9 F (36.6 C),  temperature source Oral, resp. rate 16, height 5\' 7"  (1.702 m), weight 103.9 kg (229 lb), last menstrual period 10/30/2015. Maternal Exam:  Uterine Assessment: Contraction strength is mild.  Contraction frequency is rare.   Abdomen: Patient reports no abdominal tenderness. Fetal presentation: vertex  Introitus: Normal vulva. Normal vagina.  Ferning test: positive.  Nitrazine test: positive. Amniotic fluid character: clear.  Pelvis: adequate for delivery.   Cervix: Cervix evaluated by digital exam.     Fetal Exam Fetal Monitor Review: Mode: ultrasound.   Baseline rate: 130-140.  Variability: moderate (6-25 bpm).   Pattern: accelerations present and no decelerations.    Fetal State Assessment: Category I - tracings are normal.     Physical Exam  Vitals reviewed. Constitutional: She appears well-developed and well-nourished.  Cardiovascular: Normal rate.   Respiratory: Effort normal. No respiratory distress.  GI: Soft.    Prenatal labs: ABO, Rh: --/--/O POS (11/11 1120) Antibody: NEG (11/11 1120) Rubella: Immune (07/03 0000) RPR: Non Reactive (10/21 0205)  HBsAg: Negative (07/03 0000)  HIV: Non-reactive (07/03 0000)  GBS: Positive (10/12 0000)   Assessment/Plan: IUP at [redacted]W[redacted]D with PPROM and h/o incompetent cervix, GBS +.  She has already received 2 rounds of steroids.  Admitted yesterday afternoon, on Amp and zithromax, will observe for labor or signs/symptoms of infection.     Natalie Dunn D 05/22/2016, 9:33 AM

## 2016-05-22 NOTE — Progress Notes (Signed)
Patient received from L&D to room 154. Pt denies pain or VB. Pt oriented to unit and instructed to inform nurse of VB, pain, or decreased FM.

## 2016-05-23 ENCOUNTER — Encounter (HOSPITAL_COMMUNITY): Payer: Self-pay | Admitting: Anesthesiology

## 2016-05-23 ENCOUNTER — Inpatient Hospital Stay (HOSPITAL_COMMUNITY): Payer: Medicaid Other

## 2016-05-23 MED ORDER — SODIUM CHLORIDE 0.9% FLUSH
3.0000 mL | Freq: Two times a day (BID) | INTRAVENOUS | Status: DC
  Administered 2016-05-23 – 2016-06-24 (×58): 3 mL via INTRAVENOUS

## 2016-05-23 NOTE — Progress Notes (Signed)
Patient ID: Natalie DustKiara A Jefferson, female   DOB: 01/06/1991, 25 y.o.   MRN: [id number] Pt doing well. Has questions about plan of care Explaiined will plan to complete PO antibx ( s/p iv antibx x 2 days) Will monitor for infection, ptl, fetal well being and if any concerns will proceed with delivery otherwise will plan on delivery at 34 weeks Pt missed makena dose today; will bring from office in am for her

## 2016-05-23 NOTE — Progress Notes (Signed)
Patient ID: Natalie Dunn, female   DOB: 06/19/1991, 25 y.o.   MRN: [id number] US shows baby is verted AFI MVP 2

## 2016-05-23 NOTE — Progress Notes (Signed)
Patient ID: Natalie Dunn, female   DOB: 11/18/1990, 25 y.o.   MRN: [id number] Pt doing well. Denies fever, chills or contractions. Feels occasional leakage of fluid. +Fms. No complaints VSS- afeb ABD- c/w ga, nontender EXT - no Homans EFM- cat 1 TOCO - no contractions SVE - deferred  A/P: IUP at [redacted]W[redacted]D with PPROM and h/o incompetent cervix, GBS +.   She has already received 2 rounds of steroids.  S/P two days on IV antibx (omnipen); now on Amp and zithromax - stable Will observe for labor or signs/symptoms of infection Will obtain US for fetal positioning and afi baseline

## 2016-05-24 LAB — TYPE AND SCREEN
ABO/RH(D): O POS
ANTIBODY SCREEN: NEGATIVE

## 2016-05-24 MED ORDER — HYDROXYPROGESTERONE CAPROATE 250 MG/ML IM OIL
250.0000 mg | TOPICAL_OIL | Freq: Once | INTRAMUSCULAR | Status: DC
  Filled 2016-05-24: qty 1

## 2016-05-24 MED ORDER — HYDROXYPROGESTERONE CAPROATE 250 MG/ML IM OIL
250.0000 mg | TOPICAL_OIL | Freq: Once | INTRAMUSCULAR | Status: AC
  Administered 2016-05-24: 250 mg via INTRAMUSCULAR

## 2016-05-24 NOTE — Progress Notes (Signed)
Patient ID: Natalie Dunn, female   DOB: 10/25/1990, 25 y.o.   MRN: [id number] HD #3 29 4/7 PPROM/incompetent cervix/advanced dilation  Pt denies contractions or VB +LOF Good FM  FHR category 1 on intermittent monitoring Abd NT  Baby vertex on US Amp/azithro Day 3 D/w pt in-house management until delivery, she understands

## 2016-05-24 NOTE — Progress Notes (Signed)
Patient ID: Natalie DustKiara A Jefferson, female   DOB: 03/24/1991, 25 y.o.   MRN: [id number] Pt sleeping and has had no c/o today per RN  FHR category 1  Pt received Makena from office today.  Will continue weekly until delivers.

## 2016-05-25 NOTE — Progress Notes (Signed)
Patient ID: Natalie DustKiara A Jefferson, female   DOB: 05/25/1991, 25 y.o.   MRN: [id number]   No c/o's.  +FM, cont LOF, no VB, occ ctx  AFVSS  gen NAD FHTs 140-150, good var, category 1 (NST) toco none  Day 4/7 abx for latency S/p BMZ Cont close monitoring

## 2016-05-25 NOTE — Progress Notes (Signed)
Chaplain presented to the patient to provide encouragement, and prayed for her continued good heath,  and her desire to have a safe delivery  The patient was in good spirits and appreciated the visit of support. Chaplain will follow up as needed. Chaplain Janell Quietudrey Berel Najjar [phone]

## 2016-05-26 ENCOUNTER — Encounter (HOSPITAL_COMMUNITY): Payer: Self-pay

## 2016-05-26 NOTE — Progress Notes (Signed)
HD #5, [redacted]W[redacted]D, PPROM, incompetent cervix Afeb, VSS FHT-130-140, mod variability, + accels, no significant decels, Cat I, no ctx Fundus NT Continue abx and ovservation

## 2016-05-26 NOTE — Progress Notes (Signed)
Chaplain visit with this patient who was seen on yesterday. She reports no changes, her condition remains stable with her 24 week pregnancy. Chaplain offered prayer for a safe delivery of healthy baby. She was appreciative of the support. Chaplain Janell Quietudrey Charlee Squibb [phone]

## 2016-05-27 LAB — TYPE AND SCREEN
ABO/RH(D): O POS
Antibody Screen: NEGATIVE

## 2016-05-27 NOTE — Progress Notes (Signed)
Initial Nutrition Assessment  DOCUMENTATION CODES:   Morbid obesity  INTERVENTION:  Regular diet May order double protein portions, snacks TID and from retail  NUTRITION DIAGNOSIS:  Increased nutrient needs related to  (pregnancy and fetal growth requirements) as evidenced by  (30 weeks IUP).  GOAL:  Patient will meet greater than or equal to 90% of their needs  MONITOR:  Weight trends  REASON FOR ASSESSMENT:  Antenatal   ASSESSMENT:  30 weeks, PROM. Pre-preg weight 234 lbs, BMI 36.4. No weight gain. Reports good appetite, no nausea  Diet Order:  Diet regular Room service appropriate? Yes; Fluid consistency: Thin  Skin:  Reviewed, no issues  Height:   Ht Readings from Last 1 Encounters:  05/21/16 5\' 7"  (1.702 m)   Weight:   Wt Readings from Last 1 Encounters:  05/25/16 232 lb (105.2 kg)    Ideal Body Weight:    135 lbs  BMI:  Body mass index is 36.34 kg/m.  Estimated Nutritional Needs:   Kcal:  2300-2500  Protein:  100-110 g  Fluid:  2.6 L  EDUCATION NEEDS:   No education needs identified at this time  Inez PilgrimKatherine Shyheem Whitham M.Odis LusterEd. R.D. LDN Neonatal Nutrition Support Specialist/RD III Pager [phone]      Phone [phone]

## 2016-05-27 NOTE — Progress Notes (Signed)
Patient ID: Natalie Dunn, female   DOB: 04/23/1991, 25 y.o.   MRN: [id number] Pt doing well. States has had increased gushes of fluid in past two days. Worried about fluid around baby. No contractions or VB but +Fms VSS EFM - 145, cat 1 at 1045 last night TOCO - no contractions SVE - deferred  A/P: HD #6, [redacted]W[redacted]D, PPROM, incompetent cervix         Continue on oral antibx         Discussed with mfm and will change monitoring to one hour ( extended) q shift and repeat US for afi on 11/20         Plan to monitor till 34 weeks then deliver or prn

## 2016-05-28 LAB — CBC
HEMATOCRIT: 30.1 % — AB (ref 36.0–46.0)
Hemoglobin: 10.3 g/dL — ABNORMAL LOW (ref 12.0–15.0)
MCH: 29 pg (ref 26.0–34.0)
MCHC: 34.2 g/dL (ref 30.0–36.0)
MCV: 84.8 fL (ref 78.0–100.0)
PLATELETS: 199 10*3/uL (ref 150–400)
RBC: 3.55 MIL/uL — ABNORMAL LOW (ref 3.87–5.11)
RDW: 13.8 % (ref 11.5–15.5)
WBC: 8.3 10*3/uL (ref 4.0–10.5)

## 2016-05-28 MED ORDER — AMOXICILLIN 500 MG PO CAPS: 500.0000 mg | ORAL_CAPSULE | Freq: Three times a day (TID) | ORAL | Status: DC

## 2016-05-28 NOTE — Progress Notes (Signed)
Patient ID: [name], female   DOB: 09/25/1990, 25 y.o.   MRN: [id number] Pt doing well. No complaints- denies any contractions, VB or abnl discharge VSS EFM- 150s, cat 1 TOCO - no contractions SVE - deferred  A/P: HD #7, [redacted]W[redacted]D, PPROM, incompetent cervix         S/P 7 days of antibx         Continue current care          May have wheelchair ride in hospital hallways          CBC today           BPP on 11/20

## 2016-05-28 NOTE — Progress Notes (Signed)
NST reactive and reassuring. Pt may go on wheelchair rides with family member.

## 2016-05-29 ENCOUNTER — Encounter (HOSPITAL_COMMUNITY): Payer: Self-pay

## 2016-05-29 NOTE — Progress Notes (Signed)
IV removed per patient request. Pt states that she has tried to "deal" with it hurting but now just wants it removed. Will assess pt for new IV site. Carmelina DaneERRI L Sahand Gosch, RN

## 2016-05-29 NOTE — Progress Notes (Signed)
Patient ID: Natalie DustKiara A Dunn, female   DOB: 04/05/1991, 25 y.o.   MRN: [id number] Pt doing well. No changes overnight. +Fms. Denies fever or chills. She opted not to go in wheelchair afterall  VSS  EFM- 150s, 10x10s, cat 1 TOCO - no contractions SVE - deferred  8.3>10.3<199  A/P: HD #8, [redacted]W[redacted]D, PPROM, incompetent cervix         S/P 7 days of antibx         For BPP tomorrow with MFM         [facility]          Continue to monitor; deliver if s/sx infection or labor or at [redacted]weeks ga

## 2016-05-29 NOTE — Progress Notes (Signed)
Pt refusing to let IV be restarted at this time. Pt states that she is a "hard" stick and would like to wait at this time. Pt educated on the need for an IV. Carmelina DaneERRI L Shaydon Lease, RN

## 2016-05-30 ENCOUNTER — Inpatient Hospital Stay (HOSPITAL_COMMUNITY): Payer: Medicaid Other

## 2016-05-30 LAB — TYPE AND SCREEN
ABO/RH(D): O POS
ANTIBODY SCREEN: NEGATIVE

## 2016-05-30 NOTE — Progress Notes (Signed)
Patient has requested that her IV be removed immediately.  She is tearful and almost hysterical.  Dr. Senaida Oresichardson notified, telephonically.  Patient has been updated and educated on the important rationale for the access.  Patient wishes for access to be removed now. No further.

## 2016-05-30 NOTE — Progress Notes (Signed)
Patient ID: Natalie Dunn, female   DOB: 05/15/1991, 25 y.o.   MRN: [id number] HD #9  PPROM, advanced dilation  Pt feeling fine, no contractions +FM, +LOF  afeb vss  BPP this AM 6/8 with fetal breathing but not sustained AFI 6, vtx  Abd soft NT On monitor now and NST category 1  HD #9, [redacted]W[redacted]D, PPROM, incompetent cervix S/P 7 days of antibx         S/p betamethasone 11/1 and 11/2         [facility] will bring from office         Continue to monitor; deliver if s/sx infection or labor or at [redacted]weeks ga

## 2016-05-30 NOTE — Progress Notes (Signed)
Patient had to have blood band removed this date in order to obtain access for a saline lock.  Blood band removed via anesthesia, lab tech at bedside ready to obtain new type and screen which expire this date at midnight and patient is no wishing for a new lab draw at this time.  Patient understands the need for the type and screen . No further.

## 2016-05-30 NOTE — Progress Notes (Signed)
Patient ID: Natalie DustKiara A Jefferson, female   DOB: 11/29/1990, 25 y.o.   MRN: [id number] Came to see pt regarding IV.  She showed me site of old placement and was over the ulnar prominence and very uncomfortable, understandably she wanted removed, will d/w anesthesia other options for placement.

## 2016-05-30 NOTE — Progress Notes (Signed)
Assumed care of pt after receiving report from RN Marcella.  

## 2016-05-31 MED ORDER — HYDROXYPROGESTERONE CAPROATE 250 MG/ML IM OIL
250.0000 mg | TOPICAL_OIL | INTRAMUSCULAR | Status: DC
  Administered 2016-05-31 – 2016-06-21 (×4): 250 mg via INTRAMUSCULAR

## 2016-05-31 NOTE — Progress Notes (Signed)
Patient ID: [name], female   DOB: 07/14/1990, 25 y.o.   MRN: [id number]   PPROM, s/p BMZ and 7 days antibiotics  gen NAD Abd soft, FNT  FHTs 140-150, good var, occ variable decel, category 1-2 toco rare  Continue current mgmt.

## 2016-05-31 NOTE — Progress Notes (Signed)
This was a follow up visit as pt was seen by Angelena Formhaplain Audrey Thornton last week.  Natalie Dunn was in good spirits today.  She reported no concerns or particular needs at this time, but is aware of our ongoing availability for support.  Chaplain Dyanne CarrelKaty Claressa Hughley, Bcc Pager, [phone] 12:08 PM    05/31/16 1200  Clinical Encounter Type  Visited With Patient  Visit Type Follow-up  Referral From Nurse

## 2016-06-01 NOTE — Progress Notes (Signed)
[redacted]W[redacted]D, PPROM Feeling more pelvic pressure, no ctx Afeb, VSS FHT- reactive, 140s last pm Continue observation

## 2016-06-02 LAB — TYPE AND SCREEN
ABO/RH(D): O POS
ANTIBODY SCREEN: NEGATIVE

## 2016-06-02 NOTE — Progress Notes (Signed)
Patient ID: Natalie Dunn, female   DOB: 11/10/1990, 25 y.o.   MRN: [id number] Pt doing well. Has no complaints. +Fms VSS EFM - 145, cat 1 TOCO - no contractions SVE - deferred  A/P: HD #12, [redacted]W[redacted]D, PPROM, incompetent cervix S/P 7 days of antibx         S/P  Makena 11/21 - next due 11/28         Continue to monitor; deliver if s/sx infection or labor or at [redacted]weeks ga

## 2016-06-03 NOTE — Progress Notes (Signed)
Patient ID: Natalie Dunn, female   DOB: 06/05/1991, 25 y.o.   MRN: [id number] Pt reports ache and discomfort in her legs; no calf pain or swelling, no contractions or pelvic pressure. No bleeding. +Fms  VSS, afeb  EFM - 145, cat 1 TOCO - no contractions SVE - deferred  A/P: HD #13, [redacted]W[redacted]D, PPROM, incompetent cervix S/P 7 days of antibx S/P  Makena 11/21 - next due 11/28         Likely discomfort due to baby getting heavier; can try heating pad/tylenol prn Continue to monitor; deliver if s/sx infection or labor or at [redacted]weeks ga

## 2016-06-04 NOTE — Progress Notes (Signed)
Patient ID: [name], female   DOB: 06/05/1991, 25 y.o.   MRN: [id number] Pt with no complaints since yesterday. +Fms. Denies fever or chills VSS EFM - 150, cat 1 TOCO - no contractions  A/P:  HD #14, [redacted]W[redacted]D, PPROM, incompetent cervix S/P 7 days of antibx S/P Makena 11/21 - next due 11/28         Continue to monitor; deliver if s/sx infection or labor or at [redacted]weeks ga

## 2016-06-05 LAB — TYPE AND SCREEN
ABO/RH(D): O POS
Antibody Screen: NEGATIVE

## 2016-06-05 NOTE — Progress Notes (Addendum)
Patient ID: Natalie DustKiara A Dunn, female   DOB: 08/07/1990, 25 y.o.   MRN: [id number] Pt resting comfortably, no changes Good FM, +LOF no VB no contractions  afeb vss FHR tracings category 1 with occasional mild variables  Gravid NT  HD #15, 31 2/7 weeks, PPROM, incompetent cervix S/P 7 days of antibx S/P Makena 11/21 - next due 11/28         Last betamethasone 11/1 and 11/2 Continue to monitor; deliver if s/sx infection or labor or at [redacted]weeks ga         GBS positive

## 2016-06-06 NOTE — Progress Notes (Signed)
Patient ID: Natalie DustKiara A Jefferson, female   DOB: 07/30/1990, 25 y.o.   MRN: [id number]   31+, PPROM s/p BMZ and antibiotics  No c/o's.  +FM, cont LOF, no VB, rare ctx  AFVSS gen NAD FHTs 140's, good var, category 1 toco none  Continue close monitoring.

## 2016-06-07 NOTE — Progress Notes (Signed)
[redacted]W[redacted]D, PPROM, incompetent cervix, +GBS Feels ok, nothing new, still leaking fluid Afeb, VSS FHT- Reactive NSTs Fundus-NT Continue expectant management

## 2016-06-08 NOTE — Progress Notes (Addendum)
Patient ID: [name], female   DOB: 11/28/1990, 25 y.o.   MRN: [id number] Pt reports no new complaints. No fever, contractions. +Fms. Pt declines type and screen due today - would like to skip till another day VSS EFM - cat 1, 150 TOCO - no contractions SVE - deferred  A/P: HD #18, [redacted]W[redacted]D, PPROM, incompetent cervix S/P 7 days of antibx S/P Makena 11/28 - next due 12/5 Continue to monitor; deliver if s/sx infection or labor or at [redacted]weeks ga

## 2016-06-09 ENCOUNTER — Inpatient Hospital Stay (HOSPITAL_COMMUNITY): Payer: Medicaid Other

## 2016-06-09 NOTE — Progress Notes (Signed)
Patient ID: Natalie Dunn, female   DOB: 10/16/1990, 25 y.o.   MRN: [id number] PPROM  31 6/7 weeks vtx  Pt doing well.  +LOF, no contractions  afeb vss  Abdomen soft NT  HD #19  31 6/7 weeks, PPROM, incompetent cervix        S/P 7 days of antibx        S/P Makena 11/28 - next due 12/5                Last betamethasone 11/1 and 11/2        Continue to monitor; deliver if s/sx infection or labor or at [redacted]weeks ga                GBS positive                Has not had recent US for growth, will order

## 2016-06-09 NOTE — Progress Notes (Signed)
Patient ID: Natalie Dunn, female   DOB: 07/12/1990, 25 y.o.   MRN: [id number] Pt had uneventful day, no ctx +FM  US showed:  EFW at 33%ile vtx Fetal heart appears slightly enlarged but structurally normal.  MFM recommends postnatal ECHO and pt informed of all above.  FHR Category 1

## 2016-06-10 NOTE — Progress Notes (Signed)
Patient ID: [name], female   DOB: 05/09/1991, 25 y.o.   MRN: [id number]   PPROM, s/p BMZ and antibiotics - 32weeks  No c/o's.  +FM, cont LOF, no VB, no ctx  AFVSS gen NAD Abd soft, FNT  FHTs 130-145, mod var, category 1 toco none  Continue close management Recent good growth on [place]

## 2016-06-11 NOTE — Progress Notes (Signed)
Patient ID: Natalie DustKiara A Jefferson, female   DOB: 03/18/1991, 25 y.o.   MRN: [id number]   No c/o's, +FM, cont LOF, no VB, no ctx,    AFVSS gen NAD Abd soft, NT  FHT 145, mod var, category 1 toco none  25yo [id number] at 32+ with PPROM and cervical incompetence s/p cerclage and removal S/p BMZ and antibiotics  Cont close monitoring

## 2016-06-12 NOTE — Progress Notes (Signed)
Patient ID: [name], female   DOB: 06/01/1991, 25 y.o.   MRN: [id number]   32+2 PPROM/incompetent cervix (s/p cerclage and removal) s/p BMZ and antibiotics  No c/o's.  +FM< cont LOF, no VB, no ctx  AFVSS gen NAD Abd soft, FNT  FHTs 140-145, mod var, category 1 toco none  Cont close monitoring, plan for delivery at 34 weeks 17OH p Tuesdays

## 2016-06-13 NOTE — Progress Notes (Signed)
[redacted]W[redacted]D, PPROM, incompetent cervix, +GBS Feels ok, nothing new, still leaking fluid Afeb, VSS FHT- Reactive NSTs Fundus-NT Continue expectant management

## 2016-06-14 NOTE — Progress Notes (Signed)
Patient ID: Natalie Dunn, female   DOB: 11/21/1990, 25 y.o.   MRN: [id number] Pt settled well in new room upstairs No complaints VSS EFM - 140s, cat 1 TOCO - no contractions SVE - deferred  HD #24, [redacted]W[redacted]D, PPROM, incompetent cervix S/P 7 days of antibx Makena given at 1845 - next due on 12/12 Continue to monitor; deliver if s/sx infection or labor or at [redacted]weeks ga

## 2016-06-14 NOTE — Progress Notes (Signed)
Patient ID: Natalie Dunn, female   DOB: 12/17/1990, 25 y.o.   MRN: [id number] Pt doing well. In good spirits today. Has no complaints. No fever or chills. +Fms  HD #24, [redacted]W[redacted]D, PPROM, incompetent cervix S/P 7 days of antibx Makena due today ( q tuesdays) - last done 11/28 Continue to monitor; deliver if s/sx infection or labor or at [redacted]weeks ga

## 2016-06-15 NOTE — Progress Notes (Signed)
Patient ID: Natalie DustKiara A Dunn, female   DOB: 03/01/1991, 25 y.o.   MRN: [id number] Pt doing fine  Minimal ctx and LOF off and on + FM  NST Category 1 last PM afeb vss  HD #25, 32 5/7 PPROM, incompetent cervix S/P 7 days of antibx Makena given at 12/5 yesterday- next due on 12/12 Continue to monitor; deliver if s/sx infection or labor or at [redacted]weeks ga, plan IOL on         06/28/16 if does not deliver prior.

## 2016-06-16 NOTE — Progress Notes (Addendum)
Patient ID: Natalie Dunn, female   DOB: 09/14/1990, 25 y.o.   MRN: [id number]   No c/o's except some stretching pain in pelvic;  +FM, cont LOF, no VB, no ctx  AFVSS gen NAD FHTs 130-140's, mod var, category 1-2, in earlier NST, some variables toco none Abd soft, FNT  25yo [id number] at 32+6 with PPROM, and incompetent cervix, s/p abx and BMZ x 2, had cerclage, has been clipped.  Close monitoring.

## 2016-06-17 NOTE — Progress Notes (Signed)
[redacted]W[redacted]D, PPROM, incompetent cervix, +GBS Feels ok, nothing new, still leaking fluid Afeb, VSS FHT- Reactive NSTs Fundus-NT Continue expectant management

## 2016-06-18 NOTE — Progress Notes (Signed)
[redacted]W[redacted]D, PPROM, incompetent cervix, +GBS Feels ok, nothing new, still leaking fluid Afeb, VSS FHT- Reactive NSTs Fundus-NT Continue expectant management

## 2016-06-19 NOTE — Progress Notes (Signed)
[redacted]W[redacted]D, PPROM, incompetent cervix, +GBS Feels ok, nothing new, good FM Afeb, VSS FHT- Reactive NSTs Fundus-NT Continue expectant management

## 2016-06-20 NOTE — Progress Notes (Signed)
Patient ID: Natalie Dunn, female   DOB: 02/28/1991, 25 y.o.   MRN: [id number] Pt with no complaints. Afeb with no contractions. +Fms VSS EFM - 145, cat 1 TOCO - no contractions SVE - deferred  A/P: HD #30, [redacted]W[redacted]D, PPROM, incompetent cervix S/P 7 days of antibx Makena next due on 12/12 Continue to monitor; deliver if s/sx infection or labor or at [redacted]weeks ga

## 2016-06-21 NOTE — Progress Notes (Addendum)
Patient ID: Natalie Dunn, female   DOB: 11/15/1990, 25 y.o.   MRN: [id number]   33+4 with PPROM, incompetent cervix s/p cerclage and removal; s/p abx and BMX  No c/o's.  +FM, cont LOF, no VB, no ctx.  Some pelvic pressure/soreness.    AFVSS gen NAD Abd soft, FNT FHTs 145-150, mod var, category 1 toco occc  Continue current Mgmt 17 OHP today Plan for delivery at 34 weeks

## 2016-06-22 MED ORDER — BETAMETHASONE SOD PHOS & ACET 6 (3-3) MG/ML IJ SUSP: 12.5000 mg | Freq: Once | INTRAMUSCULAR | Status: DC

## 2016-06-22 NOTE — Progress Notes (Signed)
Patient ID: Natalie DustKiara A Dunn, female   DOB: 04/30/1991, 25 y.o.   MRN: [id number] 33 5/7  PPROM, incompetent cervix s/p cerclage and removal; s/p abx and BMX  No c/o's.  +FM, cont LOF, no VB, no ctx.  Pressure when walks  In good spirits  afeb vss  Abd soft, FNT FHTs 150, category 1   Continue current Mgmt 17 OHP yesterday Plan for delivery 06/28/16.

## 2016-06-23 NOTE — Progress Notes (Signed)
[redacted]W[redacted]D, PPROM, incompetent cervix, +GBS Feels ok, nothing new, good FM Afeb, VSS FHT- Reactive NSTs Fundus-NT Continue expectant management

## 2016-06-24 ENCOUNTER — Encounter (HOSPITAL_COMMUNITY): Payer: Self-pay | Admitting: *Deleted

## 2016-06-24 LAB — CBC
HEMATOCRIT: 34 % — AB (ref 36.0–46.0)
HEMOGLOBIN: 11.6 g/dL — AB (ref 12.0–15.0)
MCH: 28.7 pg (ref 26.0–34.0)
MCHC: 34.1 g/dL (ref 30.0–36.0)
MCV: 84.2 fL (ref 78.0–100.0)
Platelets: 205 10*3/uL (ref 150–400)
RBC: 4.04 MIL/uL (ref 3.87–5.11)
RDW: 14.2 % (ref 11.5–15.5)
WBC: 9 10*3/uL (ref 4.0–10.5)

## 2016-06-24 LAB — TYPE AND SCREEN
ABO/RH(D): O POS
ANTIBODY SCREEN: NEGATIVE

## 2016-06-24 MED ORDER — DIPHENHYDRAMINE HCL 50 MG/ML IJ SOLN: 12.5000 mg | INTRAMUSCULAR | Status: DC | PRN

## 2016-06-24 MED ORDER — OXYCODONE-ACETAMINOPHEN 5-325 MG PO TABS: 2.0000 | ORAL_TABLET | ORAL | Status: DC | PRN

## 2016-06-24 MED ORDER — LACTATED RINGERS IV SOLN
500.0000 mL | Freq: Once | INTRAVENOUS | Status: AC
  Administered 2016-06-25: 500 mL via INTRAVENOUS

## 2016-06-24 MED ORDER — EPHEDRINE 5 MG/ML INJ
10.0000 mg | INTRAVENOUS | Status: DC | PRN
  Filled 2016-06-24: qty 4

## 2016-06-24 MED ORDER — PHENYLEPHRINE 40 MCG/ML (10ML) SYRINGE FOR IV PUSH (FOR BLOOD PRESSURE SUPPORT)
80.0000 ug | PREFILLED_SYRINGE | INTRAVENOUS | Status: DC | PRN
  Filled 2016-06-24: qty 5
  Filled 2016-06-24: qty 10

## 2016-06-24 MED ORDER — ACETAMINOPHEN 325 MG PO TABS
650.0000 mg | ORAL_TABLET | ORAL | Status: DC | PRN
  Administered 2016-06-25: 650 mg via ORAL

## 2016-06-24 MED ORDER — PENICILLIN G POTASSIUM 5000000 UNITS IJ SOLR
5.0000 10*6.[IU] | Freq: Once | INTRAMUSCULAR | Status: AC
  Administered 2016-06-24: 5 10*6.[IU] via INTRAVENOUS
  Filled 2016-06-24: qty 5

## 2016-06-24 MED ORDER — BUTORPHANOL TARTRATE 1 MG/ML IJ SOLN
INTRAMUSCULAR | Status: AC
  Filled 2016-06-24: qty 1

## 2016-06-24 MED ORDER — BUTORPHANOL TARTRATE 1 MG/ML IJ SOLN
1.0000 mg | INTRAMUSCULAR | Status: DC | PRN
  Administered 2016-06-24 – 2016-06-25 (×5): 1 mg via INTRAVENOUS
  Filled 2016-06-24 (×4): qty 1

## 2016-06-24 MED ORDER — LACTATED RINGERS IV SOLN: 500.0000 mL | INTRAVENOUS | Status: DC | PRN

## 2016-06-24 MED ORDER — PENICILLIN G POT IN DEXTROSE 60000 UNIT/ML IV SOLN
3.0000 10*6.[IU] | INTRAVENOUS | Status: DC
  Administered 2016-06-25 – 2016-06-26 (×7): 3 10*6.[IU] via INTRAVENOUS
  Filled 2016-06-24 (×10): qty 50

## 2016-06-24 MED ORDER — OXYTOCIN 40 UNITS IN LACTATED RINGERS INFUSION - SIMPLE MED: 2.5000 [IU]/h | INTRAVENOUS | Status: DC

## 2016-06-24 MED ORDER — FENTANYL 2.5 MCG/ML BUPIVACAINE 1/10 % EPIDURAL INFUSION (WH - ANES)
14.0000 mL/h | INTRAMUSCULAR | Status: DC | PRN
  Filled 2016-06-24: qty 100

## 2016-06-24 MED ORDER — OXYTOCIN BOLUS FROM INFUSION
500.0000 mL | Freq: Once | INTRAVENOUS | Status: AC
  Administered 2016-06-26: 500 mL via INTRAVENOUS

## 2016-06-24 MED ORDER — LACTATED RINGERS IV SOLN
INTRAVENOUS | Status: DC
  Administered 2016-06-24 – 2016-06-25 (×4): via INTRAVENOUS

## 2016-06-24 MED ORDER — SOD CITRATE-CITRIC ACID 500-334 MG/5ML PO SOLN: 30.0000 mL | ORAL | Status: DC | PRN

## 2016-06-24 MED ORDER — ONDANSETRON HCL 4 MG/2ML IJ SOLN: 4.0000 mg | Freq: Four times a day (QID) | INTRAMUSCULAR | Status: DC | PRN

## 2016-06-24 MED ORDER — LIDOCAINE HCL (PF) 1 % IJ SOLN
30.0000 mL | INTRAMUSCULAR | Status: DC | PRN
  Administered 2016-06-26: 30 mL via SUBCUTANEOUS
  Filled 2016-06-24: qty 30

## 2016-06-24 MED ORDER — OXYCODONE-ACETAMINOPHEN 5-325 MG PO TABS: 1.0000 | ORAL_TABLET | ORAL | Status: DC | PRN

## 2016-06-24 MED ORDER — PHENYLEPHRINE 40 MCG/ML (10ML) SYRINGE FOR IV PUSH (FOR BLOOD PRESSURE SUPPORT)
80.0000 ug | PREFILLED_SYRINGE | INTRAVENOUS | Status: DC | PRN
  Filled 2016-06-24: qty 5

## 2016-06-24 NOTE — Progress Notes (Signed)
Report given to Estanislado EmmsAshley Schwarz, RN.

## 2016-06-24 NOTE — Progress Notes (Signed)
Patient ID: Natalie Dunn, female   DOB: 11/15/1990, 25 y.o.   MRN: [id number] Pt with no complaints. Denies contractions or pressure today. +Fms.  VSS-afeb EFM - 140, cat 1 TOCO - no contractions SVE - deferred  A/P: HD #34, [redacted]W[redacted]D, PPROM, incompetent cervix S/P 7 days of antibx Makena last given 12/12 Scheduled for iol on 12/19 or prn

## 2016-06-24 NOTE — Progress Notes (Signed)
Patient ID: Natalie Dunn, female   DOB: 06/19/1991, 25 y.o.   MRN: [id number] Called by nurse with report pt complaining of painful cramps.  On arrival pt reports cramping pain consistent with contractions q 5 mins. Unable to note contractions on  TOCO and abdomen soft however pt is visibly uncomfortable and breathing hard every 5 mins  Bedside US noted vertex  SVE 4-5/90/-2; unchanged exam  CAT 1 strip  A/P: Pt is on HD# 34 at 2634 0/[redacted]wks gestation with PPROM and incompetent cervix that has been advanced dilated ; s/p BMZ          Pt is symptomatic for contractions          Transfer pt to L/D for preterm labor mgmt           If contractions ensue and persist to L/D will plan for svd           If contractions resolve then will keep scheduled plan for iol on 12/19

## 2016-06-25 ENCOUNTER — Encounter (HOSPITAL_COMMUNITY): Payer: Self-pay | Admitting: Anesthesiology

## 2016-06-25 MED ORDER — PHENYLEPHRINE 40 MCG/ML (10ML) SYRINGE FOR IV PUSH (FOR BLOOD PRESSURE SUPPORT): 80.0000 ug | PREFILLED_SYRINGE | INTRAVENOUS | Status: DC | PRN

## 2016-06-25 MED ORDER — OXYTOCIN 40 UNITS IN LACTATED RINGERS INFUSION - SIMPLE MED
1.0000 m[IU]/min | INTRAVENOUS | Status: DC
  Administered 2016-06-25: 2 m[IU]/min via INTRAVENOUS
  Filled 2016-06-25: qty 1000

## 2016-06-25 MED ORDER — OXYTOCIN 40 UNITS IN LACTATED RINGERS INFUSION - SIMPLE MED: 1.0000 m[IU]/min | INTRAVENOUS | Status: DC

## 2016-06-25 MED ORDER — EPHEDRINE 5 MG/ML INJ: 10.0000 mg | INTRAVENOUS | Status: DC | PRN

## 2016-06-25 MED ORDER — TERBUTALINE SULFATE 1 MG/ML IJ SOLN: 0.2500 mg | Freq: Once | INTRAMUSCULAR | Status: DC | PRN

## 2016-06-25 MED ORDER — FENTANYL 2.5 MCG/ML BUPIVACAINE 1/10 % EPIDURAL INFUSION (WH - ANES): 14.0000 mL/h | INTRAMUSCULAR | Status: DC | PRN

## 2016-06-25 MED ORDER — MAGNESIUM SULFATE BOLUS VIA INFUSION
4.0000 g | Freq: Once | INTRAVENOUS | Status: AC
  Administered 2016-06-25: 4 g via INTRAVENOUS
  Filled 2016-06-25: qty 500

## 2016-06-25 MED ORDER — DIPHENHYDRAMINE HCL 50 MG/ML IJ SOLN: 12.5000 mg | INTRAMUSCULAR | Status: DC | PRN

## 2016-06-25 MED ORDER — MAGNESIUM SULFATE 50 % IJ SOLN
2.0000 g/h | INTRAMUSCULAR | Status: DC
  Administered 2016-06-25: 2 g/h via INTRAVENOUS
  Filled 2016-06-25: qty 80

## 2016-06-25 MED ORDER — LACTATED RINGERS IV SOLN: 500.0000 mL | Freq: Once | INTRAVENOUS | Status: DC

## 2016-06-25 NOTE — Progress Notes (Signed)
Patient ID: Natalie Dunn, female   DOB: 08/24/1990, 25 y.o.   MRN: [id number]  PPROM/incompetent cervix - s/p cerclage and clipped, s/p BMZ and abx at 34+1  Some ctx/pain overnight.  NICU is full, likely transfer of infant.  Planned IOL Tuesday.    AFVSS gen NAD Abd soft, FNT FHTs 130-145, mod var, accels; some variables.  Category 1-2 toco none  34+1 PPROM d/w pt mgmt poss prenatal and postnatal transfer after discussion with neonatologist and NICU being full here.  Pt wants me to discuss with NICU after lunch, if not at all feasible [facility]  Will start Magnesium Sulfate before transfer for CP prophylaxis.

## 2016-06-25 NOTE — Anesthesia Preprocedure Evaluation (Signed)
Anesthesia Evaluation  Patient identified by MRN, date of birth, ID band Patient awake    Reviewed: Allergy & Precautions, NPO status , Patient's Chart, lab work & pertinent test results  Airway Mallampati: I  TM Distance: >3 FB Neck ROM: Full    Dental  (+) Teeth Intact, Dental Advisory Given   Pulmonary former smoker,    breath sounds clear to auscultation       Cardiovascular negative cardio ROS   Rhythm:Regular Rate:Normal     Neuro/Psych negative neurological ROS     GI/Hepatic negative GI ROS, Neg liver ROS,   Endo/Other  Morbid obesity  Renal/GU negative Renal ROS     Musculoskeletal   Abdominal   Peds  Hematology negative hematology ROS (+)   Anesthesia Other Findings   Reproductive/Obstetrics (+) Pregnancy                             Lab Results  Component Value Date   WBC 9.0 06/24/2016   HGB 11.6 (L) 06/24/2016   HCT 34.0 (L) 06/24/2016   MCV 84.2 06/24/2016   PLT 205 06/24/2016     Anesthesia Physical  Anesthesia Plan  ASA: III  Anesthesia Plan: Epidural   Post-op Pain Management:    Induction:   Airway Management Planned: Natural Airway  Additional Equipment:   Intra-op Plan:   Post-operative Plan:   Informed Consent: I have reviewed the patients History and Physical, chart, labs and discussed the procedure including the risks, benefits and alternatives for the proposed anesthesia with the patient or authorized representative who has indicated his/her understanding and acceptance.     Plan Discussed with: CRNA  Anesthesia Plan Comments:         Anesthesia Quick Evaluation

## 2016-06-25 NOTE — Progress Notes (Signed)
Patient ID: Natalie DustKiara A Jefferson, female   DOB: 02/25/1991, 25 y.o.   MRN: [id number]   During attempted epidural placement, serious nerve pain, pt desired to abandon.  +FM, cont LOF, some blood tinge.  occ ctx.  D/W pt IOL and process.  Also talked with NICU, hopefully to do consult.  AFVSS gen NAD FHTs 140's, mod var, category 1 toco rare  SVE 6/100/0-+1  Will start pitocin Pain control with stadol and Nitrous prn, poss epidural placement Expect SVD, NICU aware and has bed for baby

## 2016-06-25 NOTE — Progress Notes (Signed)
Patient ID: Natalie Dunn, female   DOB: 02/23/1991, 25 y.o.   MRN: [id number]   Uncomfortable with ctx.  Managing well with Stadol.  AFVSS gen NAD FHTs 130's, mod var, accels, occ variable/early decel, category 1-2 toco diff to trace, after IUPC appear q 2-173min  SVE 8/100/+1  Pt declines epidural for now, d/w pt likely last chance and last dose Stadol.  She acknowledges.  Likely SVD/pushing soon.Continue close monitoring

## 2016-06-25 NOTE — Progress Notes (Signed)
Patient ID: [name], female   DOB: 04/09/1991, 25 y.o.   MRN: [id number] Pt reported continued discomfort intermittently overnight- received iv stadol with some relief Cervix remains unchanged and no contractions palpated or noted per toco  Continue to monitor at this time

## 2016-06-26 ENCOUNTER — Encounter (HOSPITAL_COMMUNITY): Payer: Self-pay | Admitting: *Deleted

## 2016-06-26 LAB — RPR: RPR Ser Ql: NONREACTIVE

## 2016-06-26 MED ORDER — ZOLPIDEM TARTRATE 5 MG PO TABS: 5.0000 mg | ORAL_TABLET | Freq: Every evening | ORAL | Status: DC | PRN

## 2016-06-26 MED ORDER — COCONUT OIL OIL
1.0000 "application " | TOPICAL_OIL | Status: DC | PRN
  Administered 2016-06-27: 1 via TOPICAL
  Filled 2016-06-26: qty 120

## 2016-06-26 MED ORDER — IBUPROFEN 600 MG PO TABS
600.0000 mg | ORAL_TABLET | Freq: Four times a day (QID) | ORAL | Status: DC
  Administered 2016-06-26 – 2016-06-27 (×4): 600 mg via ORAL
  Filled 2016-06-26 (×4): qty 1

## 2016-06-26 MED ORDER — ONDANSETRON HCL 4 MG PO TABS: 4.0000 mg | ORAL_TABLET | ORAL | Status: DC | PRN

## 2016-06-26 MED ORDER — OXYCODONE HCL 5 MG PO TABS: 5.0000 mg | ORAL_TABLET | ORAL | Status: DC | PRN

## 2016-06-26 MED ORDER — PRENATAL MULTIVITAMIN CH
1.0000 | ORAL_TABLET | Freq: Every day | ORAL | Status: DC
  Administered 2016-06-26: 1 via ORAL
  Filled 2016-06-26: qty 1

## 2016-06-26 MED ORDER — PROMETHAZINE HCL 25 MG/ML IJ SOLN: 6.2500 mg | INTRAMUSCULAR | Status: DC | PRN

## 2016-06-26 MED ORDER — ONDANSETRON HCL 4 MG/2ML IJ SOLN: 4.0000 mg | INTRAMUSCULAR | Status: DC | PRN

## 2016-06-26 MED ORDER — BENZOCAINE-MENTHOL 20-0.5 % EX AERO
1.0000 "application " | INHALATION_SPRAY | CUTANEOUS | Status: DC | PRN
  Administered 2016-06-26: 1 via TOPICAL
  Filled 2016-06-26: qty 56

## 2016-06-26 MED ORDER — OXYCODONE HCL 5 MG PO TABS: 10.0000 mg | ORAL_TABLET | ORAL | Status: DC | PRN

## 2016-06-26 MED ORDER — DIBUCAINE 1 % RE OINT: 1.0000 "application " | TOPICAL_OINTMENT | RECTAL | Status: DC | PRN

## 2016-06-26 MED ORDER — DIPHENHYDRAMINE HCL 25 MG PO CAPS: 25.0000 mg | ORAL_CAPSULE | Freq: Four times a day (QID) | ORAL | Status: DC | PRN

## 2016-06-26 MED ORDER — ACETAMINOPHEN 325 MG PO TABS: 650.0000 mg | ORAL_TABLET | ORAL | Status: DC | PRN

## 2016-06-26 MED ORDER — TETANUS-DIPHTH-ACELL PERTUSSIS 5-2.5-18.5 LF-MCG/0.5 IM SUSP: 0.5000 mL | Freq: Once | INTRAMUSCULAR | Status: DC

## 2016-06-26 MED ORDER — LACTATED RINGERS IV SOLN: INTRAVENOUS | Status: DC

## 2016-06-26 MED ORDER — WITCH HAZEL-GLYCERIN EX PADS: 1.0000 "application " | MEDICATED_PAD | CUTANEOUS | Status: DC | PRN

## 2016-06-26 MED ORDER — FENTANYL CITRATE (PF) 100 MCG/2ML IJ SOLN
100.0000 ug | Freq: Once | INTRAMUSCULAR | Status: AC
  Administered 2016-06-26: 100 ug via INTRAVENOUS
  Filled 2016-06-26: qty 2

## 2016-06-26 MED ORDER — HYDROMORPHONE HCL 1 MG/ML IJ SOLN: 0.2500 mg | INTRAMUSCULAR | Status: DC | PRN

## 2016-06-26 MED ORDER — SENNOSIDES-DOCUSATE SODIUM 8.6-50 MG PO TABS
2.0000 | ORAL_TABLET | ORAL | Status: DC
  Administered 2016-06-27: 2 via ORAL
  Filled 2016-06-26: qty 2

## 2016-06-26 MED ORDER — SIMETHICONE 80 MG PO CHEW: 80.0000 mg | CHEWABLE_TABLET | ORAL | Status: DC | PRN

## 2016-06-26 NOTE — Progress Notes (Signed)
Post Partum Day 0 Subjective: no complaints, up ad lib, voiding, tolerating PO and nl lochia, pain controlled  Objective: Blood pressure (!) 101/57, pulse 83, temperature 97.9 F (36.6 C), temperature source Oral, resp. rate 20, height 5\' 7"  (1.702 m), weight 105.2 kg (232 lb), last menstrual period 10/30/2015, SpO2 100 %, unknown if currently breastfeeding.  Physical Exam:  General: alert and no distress Lochia: appropriate Uterine Fundus: firm   Recent Labs  06/24/16 2137  HGB 11.6*  HCT 34.0*    Assessment/Plan: Plan for discharge tomorrow or next day.  Routine PP care.  Baby in NICU doing well - RA, A/G, IVF   LOS: 36 days   Bovard-Stuckert, Heaven Meeker 06/26/2016, 8:24 AM

## 2016-06-26 NOTE — Lactation Note (Signed)
This note was copied from a baby's chart. Lactation Consultation Note  Patient Name: Boy Vicente MalesKiara Jefferson WUJWJ'XToday's Date: 06/26/2016 Reason for consult: Initial assessment;Infant < 6lbs;NICU baby  Pecola LeisureBaby is 6 hours old. As LC walked in the room mom was trying to pump.  LC reviewed set up of the DEBP and talked to mom while she was pumping both breast.  Per mom has pumped before for a 27 week infant 3 years ago.  Mom has large breast , tough areolas, slightly compressible. LC reviewed hand expressing , one drop noted  Out of the left. LC reassured mom the hand expressing and pumping can be a slow process and up and down.  LC reviewed supply and demand hand reviewed information from the NICU BF booklet.  #24 Flanged used at pumping session. LC had mom use the #27 for a few minutes for assessment - #27 seemed  To stimulate the areola more than the #24. LC recommended to mom for today to alterate and see the results , mom was comfortable  With both . LC felt the #21 would be to small and cause soreness. LC explained that all to mom. Cleaning of the pump pieces reviewed. LC recommended to mom after rest , to visit baby and then pump, more baby thoughts. Mother informed of post-discharge support and given phone number to the lactation department, including services for phone call assistance; out-patient appointments; and breastfeeding support group. List of other breastfeeding resources in the community given in the handout. Encouraged mother to call for problems or concerns related to breastfeeding.   Maternal Data Has patient been taught Hand Expression?: Yes (drop noted, mom able to hand express well ) Does the patient have breastfeeding experience prior to this delivery?: Yes  Feeding    LATCH Score/Interventions                      Lactation Tools Discussed/Used Tools: Pump (#24 Flange good fit for today , areola edema , LC suggested also to try #27 ) Breast pump type:  Double-Electric Breast Pump Pump Review: Setup, frequency, and cleaning;Milk Storage Initiated by:: MAI  Date initiated:: 06/26/16   Consult Status Consult Status: Follow-up Date: 06/27/16 Follow-up type: In-patient    Matilde SprangMargaret Ann Jayvien Rowlette 06/26/2016, 11:03 AM

## 2016-06-26 NOTE — Progress Notes (Signed)
Patient ID: Natalie DustKiara A Jefferson, female   DOB: 01/02/1991, 25 y.o.   MRN: [id number]   Pt 8cm since 8pm, changed  Noted at 4 pm.  Pt largely unchanged for 8 hours, but not adequate contractions.  At 4 am thought to be 9-9.5cm.  With MD exam, anterior lip reduced.  Pt to start pushing.  Expect SVD, NICU to be present for delivery as preterm and prolonged ROM.

## 2016-06-27 LAB — CBC
HEMATOCRIT: 32.5 % — AB (ref 36.0–46.0)
HEMOGLOBIN: 11.1 g/dL — AB (ref 12.0–15.0)
MCH: 29.1 pg (ref 26.0–34.0)
MCHC: 34.2 g/dL (ref 30.0–36.0)
MCV: 85.3 fL (ref 78.0–100.0)
Platelets: 210 10*3/uL (ref 150–400)
RBC: 3.81 MIL/uL — AB (ref 3.87–5.11)
RDW: 14.7 % (ref 11.5–15.5)
WBC: 7.6 10*3/uL (ref 4.0–10.5)

## 2016-06-27 MED ORDER — IBUPROFEN 800 MG PO TABS: 800.0000 mg | ORAL_TABLET | Freq: Three times a day (TID) | ORAL | 1 refills | Status: DC | PRN

## 2016-06-27 MED ORDER — PRENATAL MULTIVITAMIN CH: 1.0000 | ORAL_TABLET | Freq: Every day | ORAL | 3 refills | Status: DC

## 2016-06-27 NOTE — Progress Notes (Addendum)
Post Partum Day 1 Subjective: no complaints, up ad lib, voiding, tolerating PO and nl lochia, pain controlled  Objective: Blood pressure 127/84, pulse 82, temperature 98.3 F (36.8 C), temperature source Oral, resp. rate 18, height 5\' 7"  (1.702 m), weight 105.2 kg (232 lb), last menstrual period 10/30/2015, SpO2 100 %, unknown if currently breastfeeding.  Physical Exam:  General: alert and no distress Lochia: appropriate Uterine Fundus: firm   Recent Labs  06/24/16 2137  HGB 11.6*  HCT 34.0*    Assessment/Plan: Plan for discharge tomorrow, Breastfeeding and Lactation consult.  Baby in NICU - RA, A/G; NPO.  Pt desires d/c to home - will d/c with Motrin and PNV.  F/u 6 weeks   LOS: 37 days   Bovard-Stuckert, Mylik Pro 06/27/2016, 8:11 AM

## 2016-06-27 NOTE — Progress Notes (Signed)
Patient screened out for psychosocial assessment since none of the following apply:  Psychosocial stressors documented in mother or baby's chart  Gestation less than 32 weeks  Code at delivery   Infant with anomalies Please contact the Clinical Social Worker if specific needs arise, or by MOB's request.   Paige Vanderwoude Boyd-Gilyard, MSW, LCSW Clinical Social Work [phone]  

## 2016-06-27 NOTE — Lactation Note (Signed)
This note was copied from a baby's chart. Lactation Consultation Note  Patient Name: Natalie Dunn [id number] Date: 06/27/2016 Reason for consult: Follow-up assessment;NICU baby  NICU baby 70 hours old. Mom reports that she is pumping every 2-3 hours followed by hand expression and is seeing moisture at the breast. Mom states that she is seeing moisture at the breast, but not enough to collect. Discussed milk coming to volume. Mom gave permission to send a BF referral to [facility] and it was faxed to [facility] office. Mom aware of [facility] loaner program, and pumping rooms in NICU. Mom enc to take pumping kit. Mom states that she has a Medela pump and understands the need for a hospital-grade pump for the first two weeks. Mom aware of OP/BFSG and [place] phone line assistance after D/C.    Maternal Data    Feeding    LATCH Score/Interventions                      Lactation Tools Discussed/Used     Consult Status Consult Status: PRN    Andres Labrum 06/27/2016, 9:24 AM

## 2016-06-27 NOTE — Progress Notes (Signed)
Pt refused her AM lab draws at this time. We will try again later in the day.

## 2016-06-27 NOTE — Discharge Summary (Signed)
OB Discharge Summary     Patient Name: Natalie Dunn DOB: 05/10/1991 MRN: [id number]  Date of admission: 05/21/2016 Delivering MD: Sherian ReinBOVARD-STUCKERT, Brandye Inthavong   Date of discharge: 06/27/2016  Admitting diagnosis: 29Wks [place] Intrauterine pregnancy: 767w2d     Secondary diagnosis:  Principal Problem:   SVD (spontaneous vaginal delivery) Active Problems:   Preterm labor in third trimester   Preterm premature rupture of membranes (PPROM) with unknown onset of labor  Additional problems: h/o incompetent cervix, with cerclage - clipped due to VB     Discharge diagnosis: Preterm Pregnancy Delivered                                                                                                Post partum procedures:none  Augmentation: Pitocin  Complications: None  Hospital course:  Onset of Labor With Vaginal Delivery     25 y.o. yo [id number] at 367w2d was admitted in Latent Labor on 05/21/2016. Patient had an uncomplicated labor course as follows: Received BMZ and 7 day course of antibiotics.   Membrane Rupture Time/Date: 10:10 AM ,05/21/2016   Intrapartum Procedures: Episiotomy: None [1]                                         Lacerations:  Periurethral [8]  Patient had a delivery of a Viable infant. 06/26/2016  Information for the patient's newborn:  Suella BroadJefferson, Boy Innocence [[id number]][[id number]]  Delivery Method: Vaginal, Spontaneous Delivery (Filed from Delivery Summary)    Pateint had an uncomplicated postpartum course.  She is ambulating, tolerating a regular diet, passing flatus, and urinating well. Patient is discharged home in stable condition on 06/27/16.    Physical exam Vitals:   06/26/16 0700 06/26/16 1104 06/26/16 1858 06/27/16 0800  BP: (!) 101/57 (!) 113/97 127/84 104/72  Pulse: 83 67 82 86  Resp: 20 20 18 18   Temp: 97.9 F (36.6 C) 97.7 F (36.5 C) 98.3 F (36.8 C) 98.4 F (36.9 C)  TempSrc: Oral Oral Oral Oral  SpO2: 100% 100% 100% 100%  Weight:      Height:        General: alert and no distress Lochia: appropriate Uterine Fundus: firm  Labs: Lab Results  Component Value Date   WBC 9.0 06/24/2016   HGB 11.6 (L) 06/24/2016   HCT 34.0 (L) 06/24/2016   MCV 84.2 06/24/2016   PLT 205 06/24/2016   CMP Latest Ref Rng & Units 11/12/2012  Glucose 70 - 99 mg/dL 130(Q106(H)  BUN 6 - 23 mg/dL 8  Creatinine 6.570.50 - 8.461.10 mg/dL 9.620.65  Sodium 952135 - 841145 mEq/L 134(L)  Potassium 3.5 - 5.1 mEq/L 3.4(L)  Chloride 96 - 112 mEq/L 103  CO2 19 - 32 mEq/L 22  Calcium 8.4 - 10.5 mg/dL 9.4  Total Protein 6.0 - 8.3 g/dL 6.0  Total Bilirubin 0.3 - 1.2 mg/dL 3.2(G0.2(L)  Alkaline Phos 39 - 117 U/L 53  AST 0 - 37 U/L 17  ALT 0 - 35 U/L 18  Discharge instruction: per After Visit Summary and "Baby and Me Booklet".  After visit meds:  Allergies as of 06/27/2016   No Known Allergies     Medication List    STOP taking these medications   metroNIDAZOLE 500 MG tablet Commonly known as:  FLAGYL   progesterone 200 MG capsule Commonly known as:  PROMETRIUM     TAKE these medications   ibuprofen 800 MG tablet Commonly known as:  ADVIL,MOTRIN Take 1 tablet (800 mg total) by mouth every 8 (eight) hours as needed.   prenatal multivitamin Tabs tablet Take 1 tablet by mouth at bedtime.       Diet: routine diet  Activity: Advance as tolerated. Pelvic rest for 6 weeks.   Outpatient follow up:6 weeks Follow up Appt:No future appointments. Follow up Visit:No Follow-up on file.  Postpartum contraception: Undecided  Newborn Data: Live born female  Birth Weight: 4 lb 5.1 oz (1960 g) APGAR: 8, 9  Baby Feeding: Breast Disposition:NICU   06/27/2016 Sherian ReinBovard-Stuckert, Reverie Vaquera, MD

## 2016-06-27 NOTE — Progress Notes (Signed)
Patient discharged home with significant other. Discharge paperwork, instructions, follow-up appts, and prescriptions reviewed with patient. No questions at this time. Infant to remain in NICU.

## 2016-06-28 ENCOUNTER — Inpatient Hospital Stay (HOSPITAL_COMMUNITY): Admit: 2016-06-28 | Payer: Managed Care, Other (non HMO)

## 2016-06-29 ENCOUNTER — Ambulatory Visit: Payer: Self-pay

## 2016-06-29 NOTE — Lactation Note (Signed)
This note was copied from a baby's chart. Lactation Consultation Note  Patient Name: Natalie Dunn MalesKiara Jefferson [id number]'s Date: 06/29/2016 Reason for consult: Follow-up assessment;NICU baby  NICU baby 7376 hours old. Mom called to discuss that her milk supply is not increasing. Discussed progression of milk coming to volume. Mom reports that she did not initially pump every 2-3 hours, but has been pumping more. Mom also states that she has nipple piercing, which she did not have with first child, and she has been pumping one breast at a time. Enc mom to pump both breast simultaneously every 2-3 hours for a total of 8 times/24 hours followed by hand expression. Discussed ways of managing stress and fatigue and pumping schedule. Discussed the benefits of having baby at the breast as well. Offered to assist mom with a pumping session and examine breasts, but mom states that she did not bring all of her equipment. [facility]   Maternal Data    Feeding Feeding Type: Formula Nipple Type: Slow - flow Length of feed: 12 min  LATCH Score/Interventions                      Lactation Tools Discussed/Used     Consult Status Consult Status: PRN    Sherlyn HayJennifer D Miquel Lamson 06/29/2016, 9:09 AM

## 2016-06-30 ENCOUNTER — Ambulatory Visit: Payer: Self-pay

## 2016-06-30 NOTE — Lactation Note (Signed)
This note was copied from a baby's chart. Lactation Consultation Note  Patient Name: Boy Malaka Ruffner [id number] Date: 06/30/2016 Reason for consult: Follow-up assessment;NICU baby  NICU baby 34 days old. Met with mom in the NICU pumping room. Mom reports that her comfort, pumping and EBM volumes have all improved. Mom is using #27 flanges and it is a good fit. Mom reports that she is getting about an ounce from each breast at each pumping session, and her breasts are starting to feel more full.   Maternal Data    Feeding Feeding Type: Breast Milk with Formula added (BM 1:1  30) Nipple Type: Slow - flow Length of feed: 10 min  LATCH Score/Interventions                      Lactation Tools Discussed/Used     Consult Status Consult Status: PRN    Andres Labrum 06/30/2016, 6:55 PM

## 2016-07-01 ENCOUNTER — Ambulatory Visit: Payer: Self-pay

## 2016-07-01 NOTE — Lactation Note (Signed)
This note was copied from a baby's chart. Lactation Consultation Note  Patient Name: Boy Vicente MalesKiara Jefferson ZOXWR'UToday's Date: 07/01/2016 Reason for consult: Follow-up assessment;NICU baby  NICU baby 625 days old. Parents roomed in with baby overnight. Mom reports that she is pumping increasing amounts of EBM--up to 3 ounces now. Mom states that she did have some fullness and hard/tight areas overnight, but she was able to massage and pump to soften breasts. Mom reports that she has not put the baby to breast and does not want to at this time. Offered to assist with latching and discussed the importance of latching--discussing STS and attempting at the breast. But mom declined assistance and stated that she may just keep pumping and bottle-feeding. Mom aware of OP/BFSG and LC phone line assistance after D/C.  Maternal Data    Feeding Feeding Type: Breast Milk with Formula added Nipple Type: Slow - flow  LATCH Score/Interventions                      Lactation Tools Discussed/Used Tools: Pump Breast pump type: Double-Electric Breast Pump   Consult Status Consult Status: PRN    Sherlyn HayJennifer D Jawana Reagor 07/01/2016, 10:31 AM

## 2016-08-07 ENCOUNTER — Encounter (HOSPITAL_COMMUNITY): Payer: Self-pay | Admitting: *Deleted

## 2016-08-07 ENCOUNTER — Emergency Department (HOSPITAL_COMMUNITY)
Admission: EM | Admit: 2016-08-07 | Discharge: 2016-08-07 | Disposition: A | Discharge status: Home / Self Care | Payer: Medicaid Other | Attending: Emergency Medicine | Admitting: Emergency Medicine

## 2016-08-07 DIAGNOSIS — Z79899 Other long term (current) drug therapy: Secondary | ICD-10-CM | POA: Diagnosis not present

## 2016-08-07 DIAGNOSIS — I10 Essential (primary) hypertension: Secondary | ICD-10-CM | POA: Diagnosis not present

## 2016-08-07 DIAGNOSIS — O9973 Diseases of the skin and subcutaneous tissue complicating the puerperium: Secondary | ICD-10-CM | POA: Diagnosis not present

## 2016-08-07 DIAGNOSIS — Z87891 Personal history of nicotine dependence: Secondary | ICD-10-CM | POA: Diagnosis not present

## 2016-08-07 DIAGNOSIS — R21 Rash and other nonspecific skin eruption: Secondary | ICD-10-CM | POA: Diagnosis not present

## 2016-08-07 LAB — URINALYSIS, ROUTINE W REFLEX MICROSCOPIC
Bilirubin Urine: NEGATIVE
GLUCOSE, UA: NEGATIVE mg/dL
HGB URINE DIPSTICK: NEGATIVE
Ketones, ur: NEGATIVE mg/dL
Leukocytes, UA: NEGATIVE
Nitrite: NEGATIVE
Protein, ur: NEGATIVE mg/dL
SPECIFIC GRAVITY, URINE: 1.023 (ref 1.005–1.030)
pH: 6 (ref 5.0–8.0)

## 2016-08-07 MED ORDER — TRIAMCINOLONE ACETONIDE 0.025 % EX OINT: 1.0000 "application " | TOPICAL_OINTMENT | Freq: Two times a day (BID) | CUTANEOUS | 0 refills | Status: DC

## 2016-08-07 NOTE — ED Triage Notes (Signed)
Pt reports rash and itching to stomach, arms, back. Has been to ucc recently for same and given prescriptions with no relief.

## 2016-08-07 NOTE — Discharge Instructions (Signed)
Get help right away if: °You develop a rash that covers all or most of your body. The rash may or may not be painful. °You develop blisters that: °Are on top of the rash. °Grow larger or grow together. °Are painful. °Are inside your nose or mouth. °You develop a rash that: °Looks like purple pinprick-sized spots all over your body. °Has a “bull's eye” or looks like a target. °Is not related to sun exposure, is red and painful, and causes your skin to peel. °

## 2016-08-07 NOTE — ED Provider Notes (Signed)
[facility]-EMERGENCY DEPT Provider Note   CSN: [id number] Arrival date & time: 08/07/16  1215  By signing my name below, I, Natalie Dunn, attest that this documentation has been prepared under the direction and in the presence of Arthor CaptainAbigail Zedric Deroy, PA-C Electronically Signed: Octavia HeirArianna Dunn, ED Scribe. 08/07/16. 2:35 PM.    History   Chief Complaint Chief Complaint  Patient presents with  . Rash    The history is provided by the patient. No language interpreter was used.   HPI Comments: Natalie Dunn is a 26 y.o. female who presents to the Emergency Department complaining of a moderate, gradual worsening, generalized itchy rash x 1.5 weeks. She has an erythematous rash to her back, bilateral breasts, abdomen, bilateral arms, and bilateral hips. Pt has not had any new food or environmental exposure. [facility] her [facility] and was diagnosed with scabies and/or bed bugs. [facility] but expresses using it twice without any relief. Pt has also tried using hydrocortisone cream and calamine lotion of her rash without relief. She is currently one month postpartum and breast feeds. She denies hx of HTN.   Past Medical History:  Diagnosis Date  . SVD (spontaneous vaginal delivery) 11/2012   x 1  . SVD (spontaneous vaginal delivery) 06/26/2016    Patient Active Problem List   Diagnosis Date Noted  . SVD (spontaneous vaginal delivery) 06/26/2016  . Preterm premature rupture of membranes (PPROM) with unknown onset of labor 05/21/2016  . Preterm labor in third trimester 04/30/2016  . Cervical incompetence during pregnancy in second trimester 04/29/2016  . Group B Streptococcus carrier, antepartum 04/24/2016  . Incompetent cervix during second trimester, antepartum 04/22/2016  . Incompetent cervix in pregnancy, antepartum, second trimester 04/21/2016  . Incompetent cervix 09/19/2012    Past Surgical History:  Procedure Laterality Date  .  CERVICAL CERCLAGE N/A 09/19/2012   Procedure: CERCLAGE CERVICAL;  Surgeon: Oliver PilaKathy W Richardson, MD;  Location: [facility] ORS;  Service: Gynecology;  Laterality: N/A;  . CERVICAL CERCLAGE N/A 02/08/2016   Procedure: CERCLAGE CERVICAL;  Surgeon: Huel CoteKathy Richardson, MD;  Location: [facility] ORS;  Service: Gynecology;  Laterality: N/A;    OB History    Gravida Para Term Preterm AB Living   5 2 0 2 3 2    SAB TAB Ectopic Multiple Live Births   2 0 0 0 2       Home Medications    Prior to Admission medications   Medication Sig Start Date End Date Taking? Authorizing Provider  ibuprofen (ADVIL,MOTRIN) 800 MG tablet Take 1 tablet (800 mg total) by mouth every 8 (eight) hours as needed. 06/27/16   Sherian ReinJody Bovard-Stuckert, MD  Prenatal Vit-Fe Fumarate-FA (PRENATAL MULTIVITAMIN) TABS tablet Take 1 tablet by mouth at bedtime. 06/27/16   Sherian ReinJody Bovard-Stuckert, MD    Family History Family History  Problem Relation Age of Onset  . Diabetes Mother   . Hypertension Mother   . Diabetes Father   . Hypertension Father     Social History Social History  Substance Use Topics  . Smoking status: Former Games developermoker  . Smokeless tobacco: Never Used  . Alcohol use No     Allergies   Patient has no known allergies.   Review of Systems Review of Systems  Constitutional: Negative for fever.  HENT: Negative for trouble swallowing.   Skin: Positive for rash.     Physical Exam Updated Vital Signs BP 142/100 (BP Location: Right Arm)  Pulse 84   Temp 98.2 F (36.8 C) (Oral)   Resp 18   SpO2 100%   Physical Exam  Constitutional: She is oriented to person, place, and time. She appears well-developed and well-nourished.  HENT:  Head: Normocephalic.  Eyes: EOM are normal.  Neck: Normal range of motion.  Pulmonary/Chest: Effort normal.  Abdominal: She exhibits no distension.  Musculoskeletal: Normal range of motion.  Neurological: She is alert and oriented to person, place, and time.  Skin: Rash noted. There is  erythema.  Multiple small, singular papules at are erythematous to the back, breast, abdomen, arms, and hips   Psychiatric: She has a normal mood and affect.  Nursing note and vitals reviewed.    ED Treatments / Results  DIAGNOSTIC STUDIES: Oxygen Saturation is 100% on RA, normal by my interpretation.  COORDINATION OF CARE:  2:34 PM Discussed treatment plan with pt at bedside and pt agreed to plan.  Labs (all labs ordered are listed, but only abnormal results are displayed) Labs Reviewed - No data to display  EKG  EKG Interpretation None       Radiology No results found.  Procedures Procedures (including critical care time)  Medications Ordered in ED Medications - No data to display   Initial Impression / Assessment and Plan / ED Course  I have reviewed the triage vital signs and the nursing notes.  Pertinent labs & imaging results that were available during my care of the patient were reviewed by me and considered in my medical decision making (see chart for details).  Clinical Course as of Aug 08 2309  Wynelle Link Aug 07, 2016  1522 Patient noted to be hypertensive- 1 month post partum I will collect a urine to look for urine protein  [AH]    Clinical Course User Index [AH] Arthor Captain, PA-C     Final Clinical Impressions(s) / ED Diagnoses   Final diagnoses:  Hypertension, unspecified type  Rash  Patient with nonspecific eruption. No signs of infection. Discharge with symptomatic treatment. Follow up with PCP in 2-3 days. Patient noted to be hypertensive. Her urine is negative for protein. I have discussed this with Dr. Rosalia Hammers. Patient is advised to follow up with her pcp regarding htn.it does not appears to reflect preeclampsia. Appears safe for discharge at this time.  I personally performed the services described in this documentation, which was scribed in my presence. The recorded information has been reviewed and is accurate.      New Prescriptions New  Prescriptions   No medications on file     Arthor Captain, PA-C 08/08/16 2313    Margarita Grizzle, MD 08/10/16 [phone]

## 2017-06-08 ENCOUNTER — Other Ambulatory Visit: Payer: Self-pay

## 2017-06-08 ENCOUNTER — Encounter (HOSPITAL_COMMUNITY): Payer: Self-pay

## 2017-06-08 ENCOUNTER — Emergency Department (HOSPITAL_COMMUNITY)
Admission: EM | Admit: 2017-06-08 | Discharge: 2017-06-08 | Disposition: A | Discharge status: Home / Self Care | Payer: BC Managed Care – PPO | Attending: Emergency Medicine | Admitting: Emergency Medicine

## 2017-06-08 DIAGNOSIS — Z87891 Personal history of nicotine dependence: Secondary | ICD-10-CM | POA: Insufficient documentation

## 2017-06-08 DIAGNOSIS — Z79899 Other long term (current) drug therapy: Secondary | ICD-10-CM | POA: Insufficient documentation

## 2017-06-08 DIAGNOSIS — Y929 Unspecified place or not applicable: Secondary | ICD-10-CM | POA: Insufficient documentation

## 2017-06-08 DIAGNOSIS — Y999 Unspecified external cause status: Secondary | ICD-10-CM | POA: Insufficient documentation

## 2017-06-08 DIAGNOSIS — R51 Headache: Secondary | ICD-10-CM | POA: Diagnosis not present

## 2017-06-08 DIAGNOSIS — Y9389 Activity, other specified: Secondary | ICD-10-CM | POA: Diagnosis not present

## 2017-06-08 MED ORDER — METHOCARBAMOL 500 MG PO TABS: 500.0000 mg | ORAL_TABLET | Freq: Two times a day (BID) | ORAL | 0 refills | Status: DC

## 2017-06-08 MED ORDER — IBUPROFEN 600 MG PO TABS: 600.0000 mg | ORAL_TABLET | Freq: Four times a day (QID) | ORAL | 0 refills | Status: DC | PRN

## 2017-06-08 MED ORDER — LIDOCAINE 5 % EX PTCH: 1.0000 | MEDICATED_PATCH | CUTANEOUS | 0 refills | Status: DC
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
## 2017-06-08 NOTE — Discharge Instructions (Signed)
Expect your soreness to increase over the next 2-3 days. Take it easy, but do not lay around too much as this may make any stiffness worse.  Antiinflammatory medications: Take 600 mg of ibuprofen every 6 hours or 440 mg (over the counter dose) to 500 mg (prescription dose) of naproxen every 12 hours for the next 3 days. After this time, these medications may be used as needed for pain. Take these medications with food to avoid upset stomach. Choose only one of these medications, do not take them together.  Tylenol: Should you continue to have additional pain while taking the ibuprofen or naproxen, you may add in tylenol as needed. Your daily total maximum amount of tylenol from all sources should be limited to 4000mg /day for persons without liver problems, or 2000mg /day for those with liver problems. Muscle relaxer: Robaxin is a muscle relaxer and may help loosen stiff muscles. Do not take the Robaxin while driving or performing other dangerous activities.  Lidocaine patches: These are available via either prescription or over-the-counter. The over-the-counter option may be more economical one and are likely just as effective. There are multiple over-the-counter brands, such as Salonpas. Exercises: Be sure to perform the attached exercises starting with three times a week and working up to performing them daily. This is an essential part of preventing long term problems.   Follow up with a primary care provider for any future management of these complaints. Return: Return to the ED should any symptoms worsen.

## 2017-06-23 ENCOUNTER — Other Ambulatory Visit: Payer: Self-pay | Admitting: Obstetrics and Gynecology

## 2017-06-23 DIAGNOSIS — E049 Nontoxic goiter, unspecified: Secondary | ICD-10-CM

## 2017-06-27 ENCOUNTER — Other Ambulatory Visit (HOSPITAL_COMMUNITY): Payer: Self-pay | Admitting: Obstetrics and Gynecology

## 2017-06-27 DIAGNOSIS — Z8751 Personal history of pre-term labor: Secondary | ICD-10-CM

## 2017-06-29 ENCOUNTER — Ambulatory Visit
Admission: RE | Admit: 2017-06-29 | Discharge: 2017-06-29 | Disposition: A | Discharge status: Home / Self Care | Payer: BC Managed Care – PPO | Source: Ambulatory Visit | Attending: Obstetrics and Gynecology | Admitting: Obstetrics and Gynecology

## 2017-06-29 DIAGNOSIS — E049 Nontoxic goiter, unspecified: Secondary | ICD-10-CM

## 2017-07-21 ENCOUNTER — Ambulatory Visit (HOSPITAL_COMMUNITY): Payer: BC Managed Care – PPO

## 2017-07-21 ENCOUNTER — Ambulatory Visit (HOSPITAL_COMMUNITY)
Admission: RE | Admit: 2017-07-21 | Discharge: 2017-07-21 | Disposition: A | Discharge status: Home / Self Care | Payer: BC Managed Care – PPO | Source: Ambulatory Visit | Attending: Obstetrics and Gynecology | Admitting: Obstetrics and Gynecology

## 2017-07-21 DIAGNOSIS — N7011 Chronic salpingitis: Secondary | ICD-10-CM | POA: Diagnosis not present

## 2017-07-21 DIAGNOSIS — Z8751 Personal history of pre-term labor: Secondary | ICD-10-CM | POA: Diagnosis present

## 2017-07-21 MED ORDER — IOPAMIDOL (ISOVUE-300) INJECTION 61%
30.0000 mL | Freq: Once | INTRAVENOUS | Status: AC | PRN
  Administered 2017-07-21: 30 mL via INTRAVENOUS

## 2017-12-21 IMAGING — US US MFM OB TRANSVAGINAL
1 series · 15 of 27 positions shown · non-contrast
Comparison: none

[Series 1: us mfm ob transvaginal · 27 acquisitions, 15 frames shown]
[im 1/27]
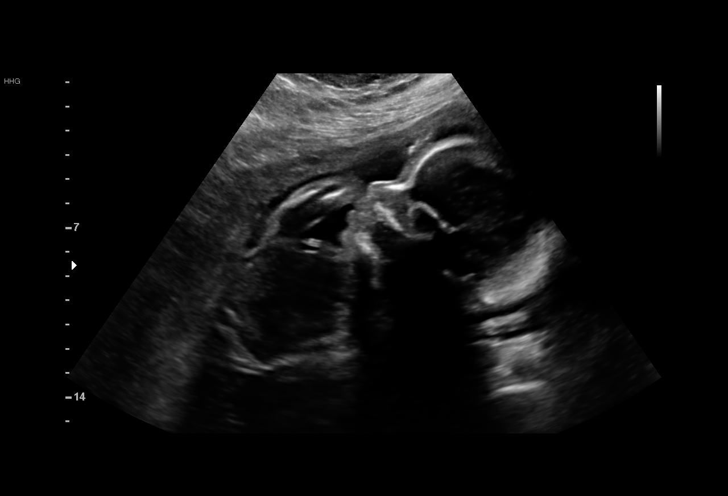
[im 3/27]
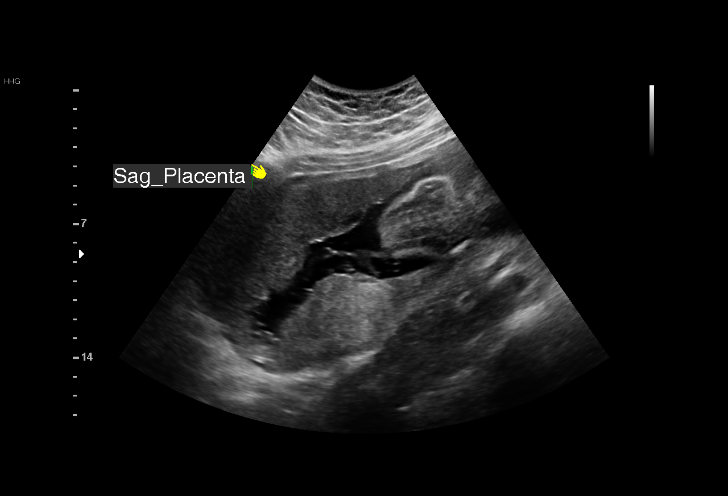
[im 5/27]
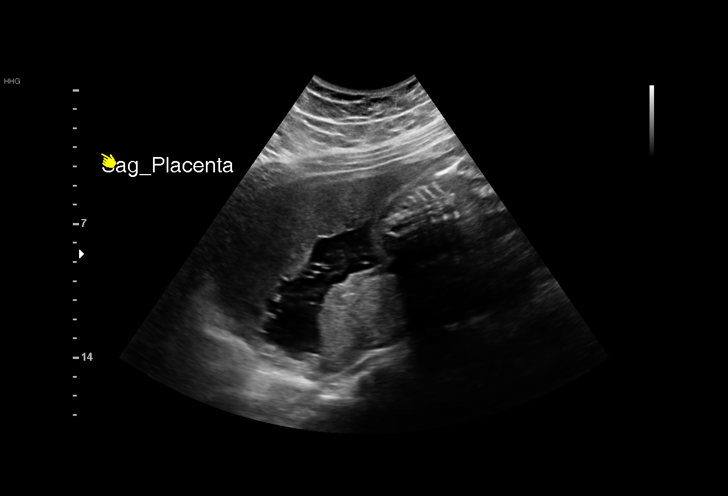
[im 7/27]
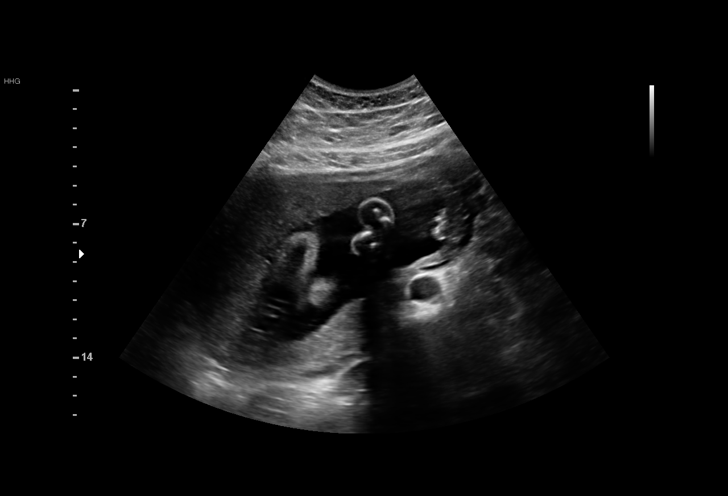
[im 9/27]
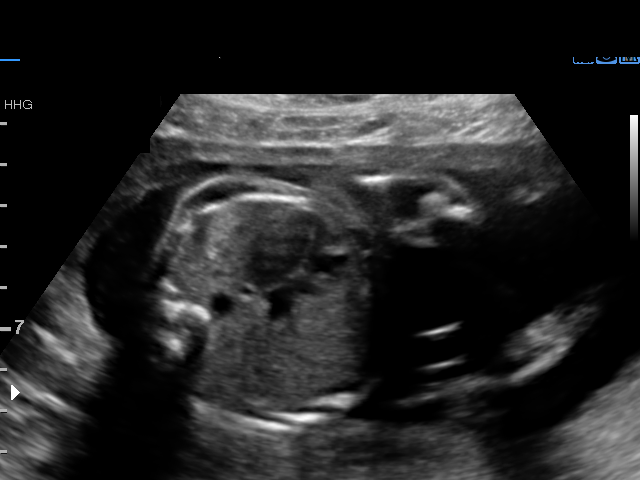
[im 10/27]
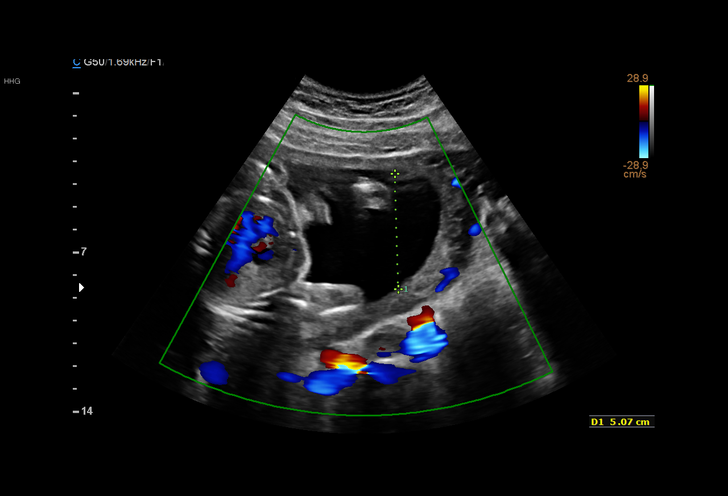
[im 12/27]
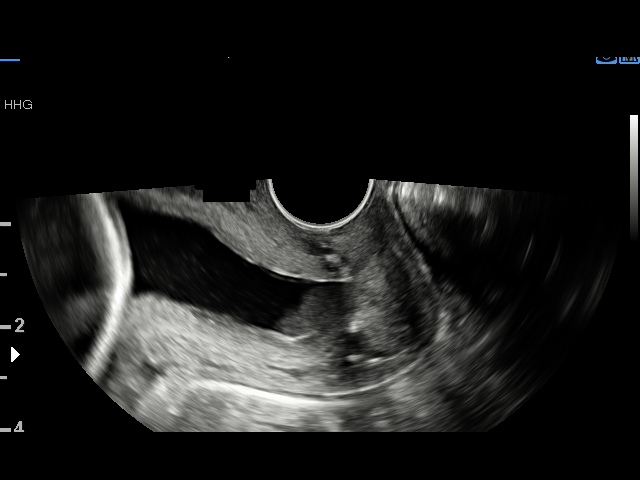
[im 14/27]
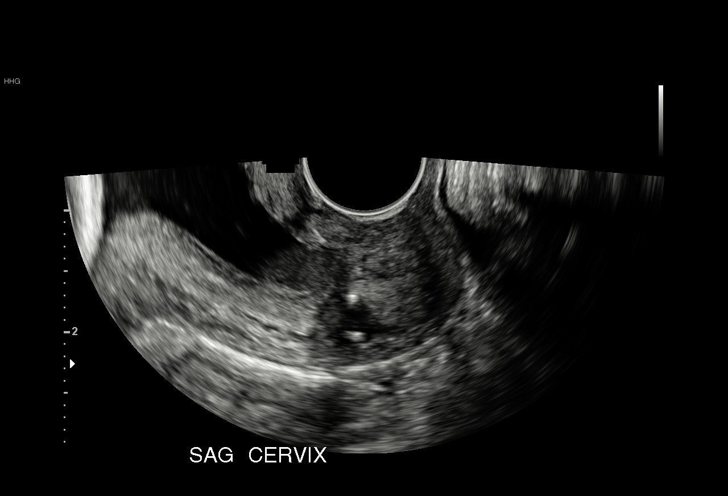
[im 16/27]
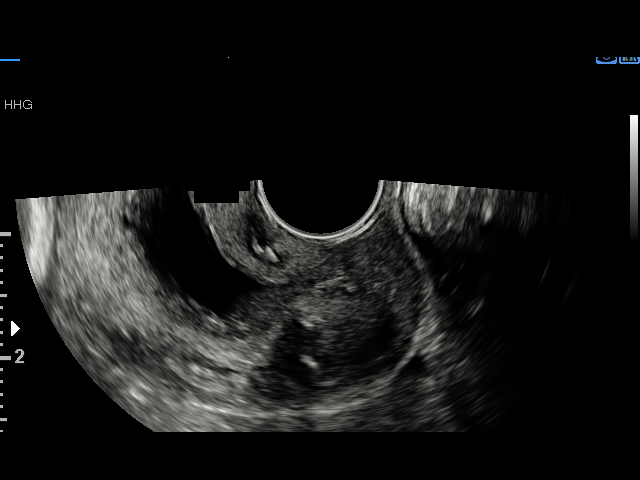
[im 18/27]
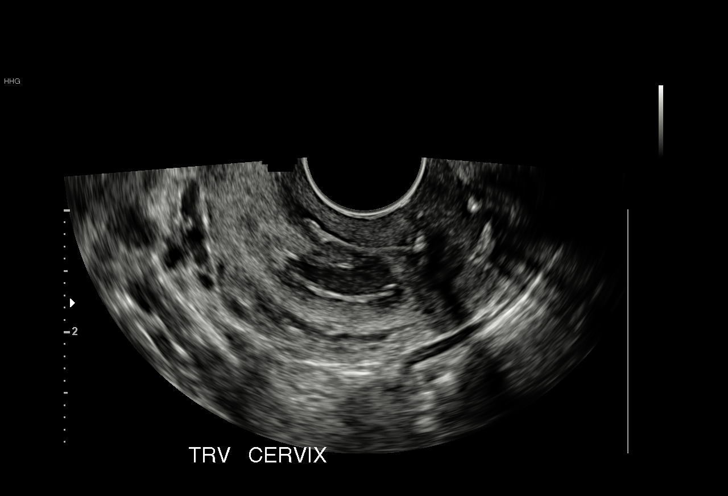
[im 19/27]
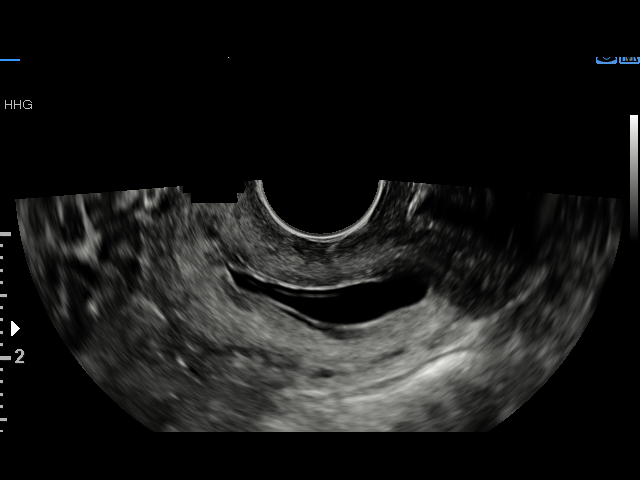
[im 21/27]
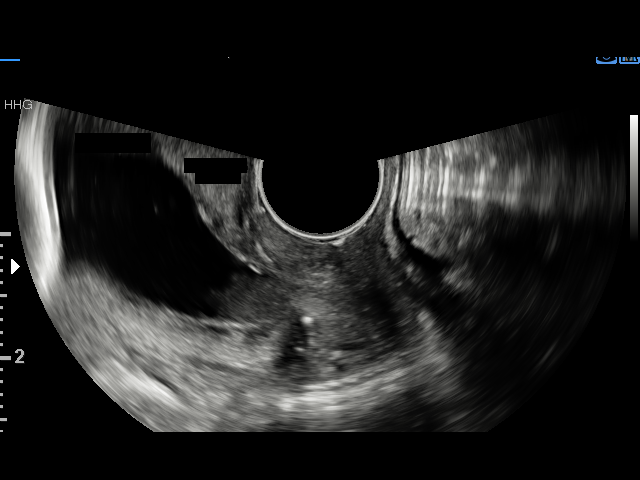
[im 23/27]
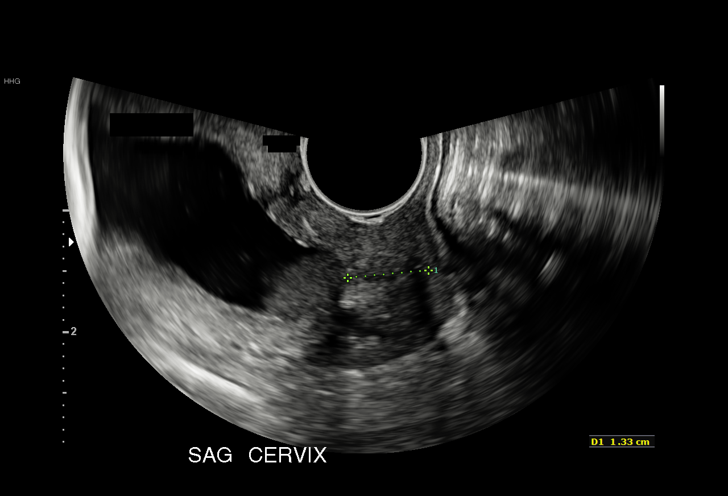
[im 25/27]
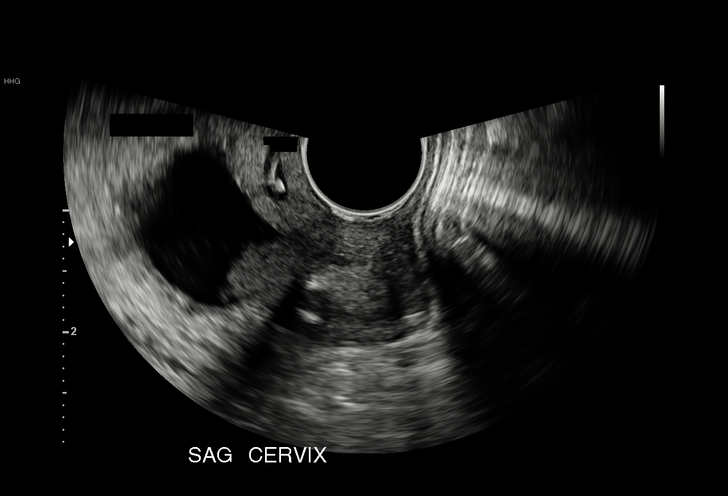
[im 27/27]
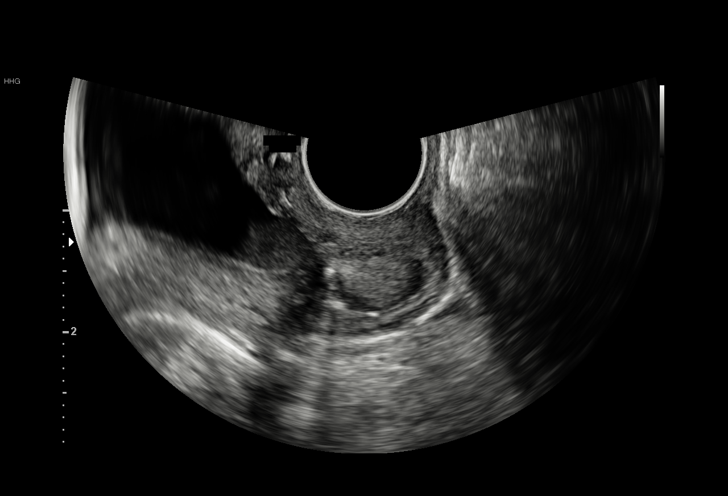

[15 of 27 positions shown; findings below may reference images not displayed]

Name:       RULANCITO JONGI              Visit Date:  04/22/2016 [DATE]

[facility]
MAU/Triage

1  RYUSAKU RIND             [id number]      [id number]     [id number]
AKU
2  RYUSAKU RIND             [id number]      [id number]     [id number]
AKU
Indications

25 weeks gestation of pregnancy
OB History

Gravidity:    4         Term:   1        Prem:   1        SAB:   2
Living:       1
Fetal Evaluation

Num Of Fetuses:     1
Fetal Heart         164
Rate(bpm):
Cardiac Activity:   Observed
Presentation:       Cephalic
Placenta:           Anterior, above cervical os

Amniotic Fluid
AFI FV:      Subjectively within normal limits

Largest Pocket(cm)
5.1
Gestational Age
Clinical EDD:  25w 0d                                        EDD:   08/05/16
Best:          25w 0d    Det. By:   Clinical EDD             EDD:   08/05/16
Cervix Uterus Adnexa

Cervix
Length:           1.32  cm.
Measured transvaginally. Cerclage visualized. Funneling of internal
os noted.
Impression

Single IUP at 25w 0d
Limited ultrasound performed for cervical length
Cephalic presentation
Normal amniotic fluid volume

TVUS - cervical length 1.3 cm with funneling that extends to
the cerclage stitch
Some cervical debris is noted
Recommendations

Follow-up ultrasounds as clinically indicated.

## 2017-12-28 IMAGING — US US MFM OB TRANSVAGINAL
1 series · 15 of 28 positions shown · non-contrast
Comparison: none

[Series 1: us mfm ob transvaginal · 35 acquisitions, 15 frames shown]
[im 1/35]
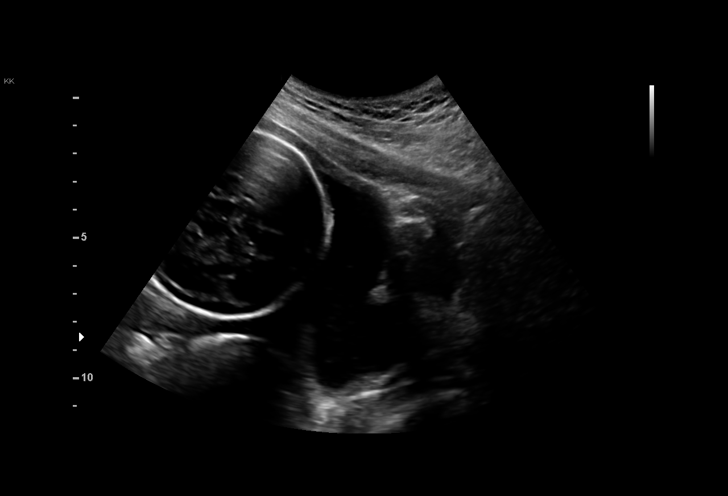
[im 3/35]
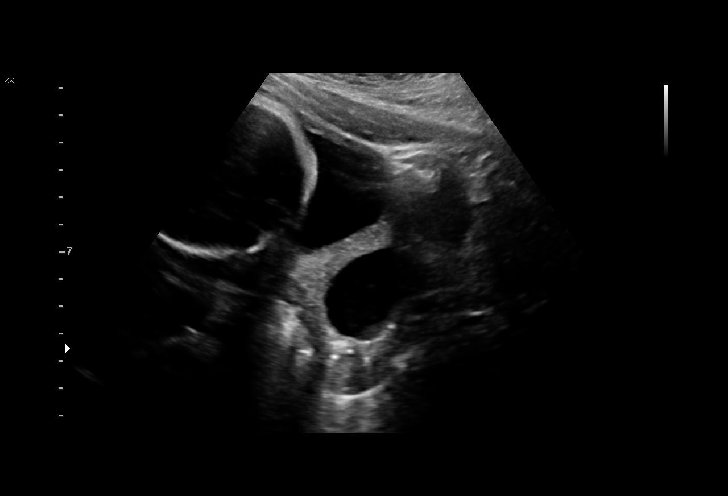
[im 6/35]
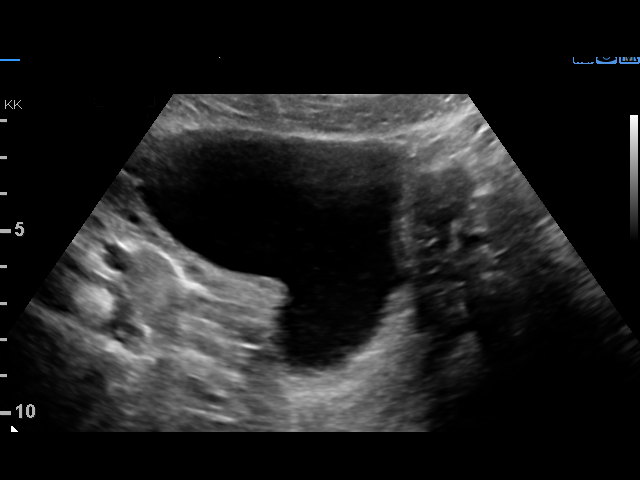
[im 8/35]
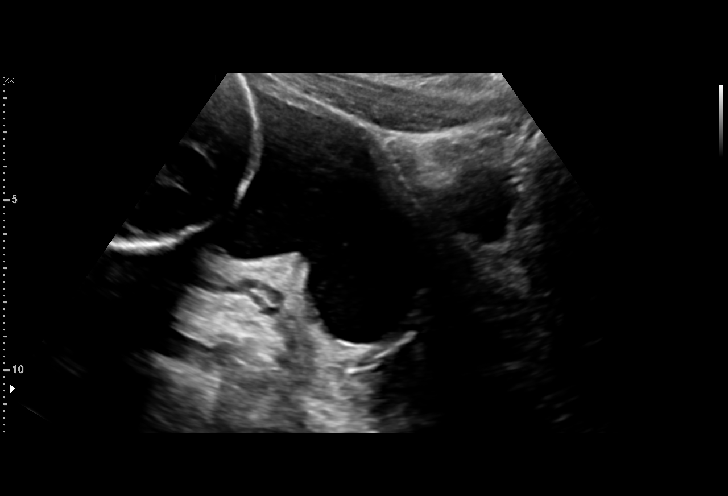
[im 11/35]
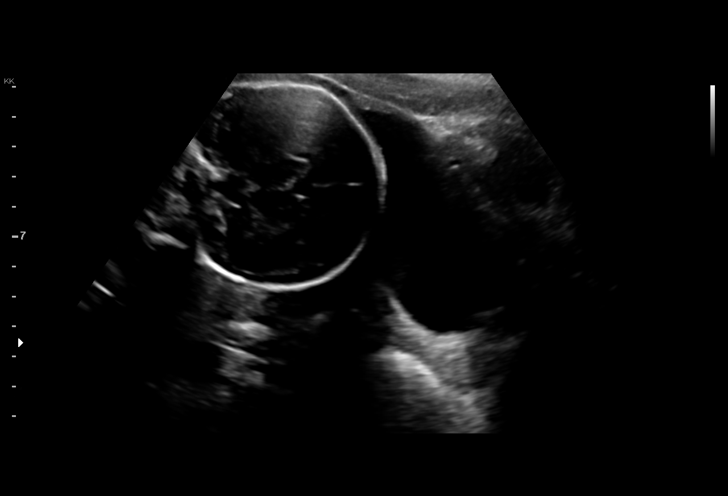
[im 13/35]
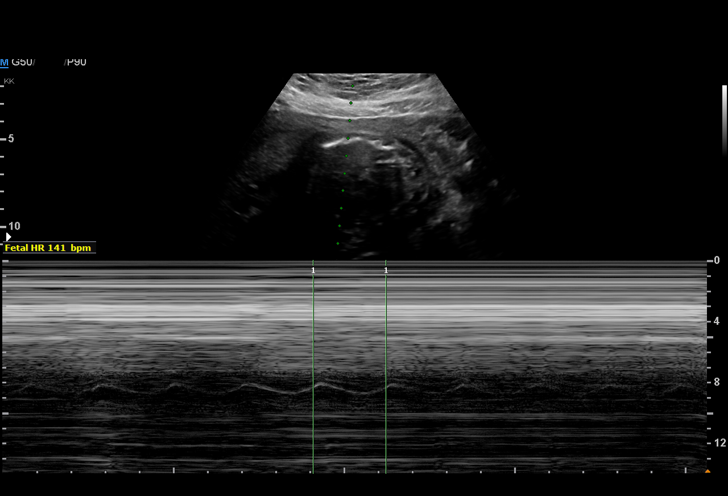
[im 16/35]
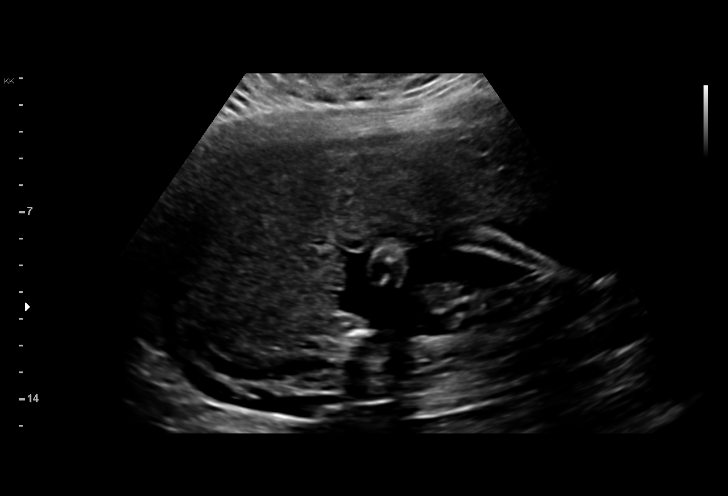
[im 18/35]
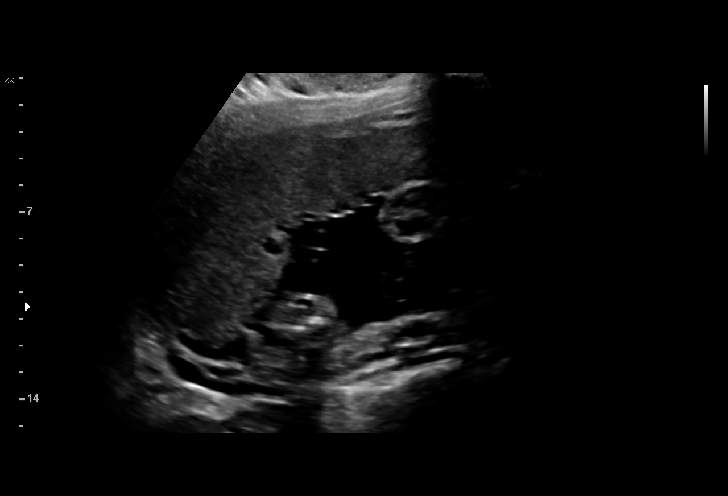
[im 19/35]
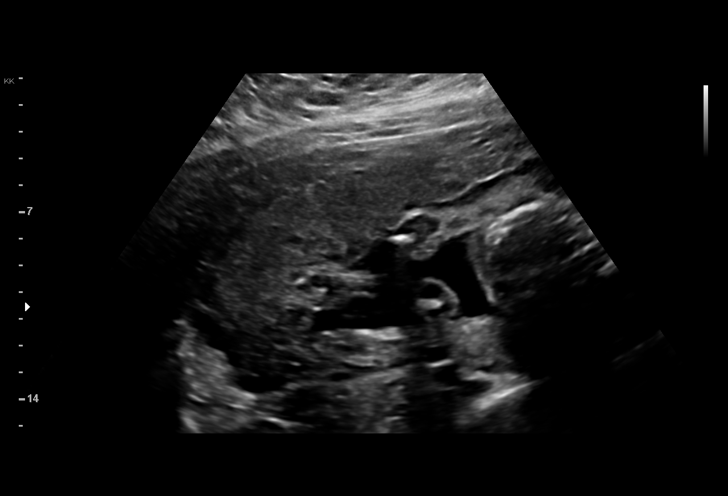
[im 22/35]
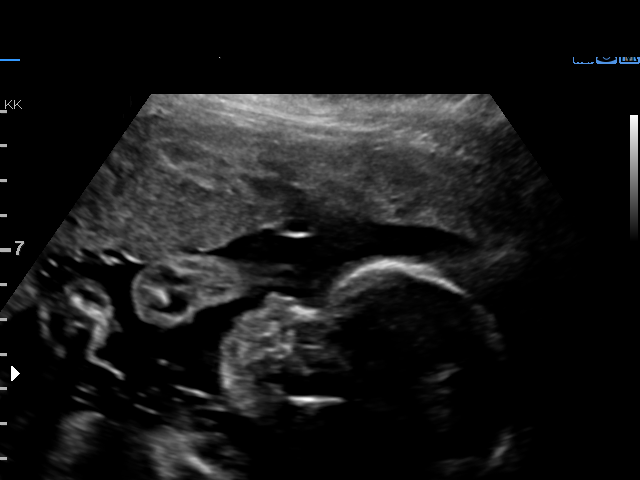
[im 24/35]
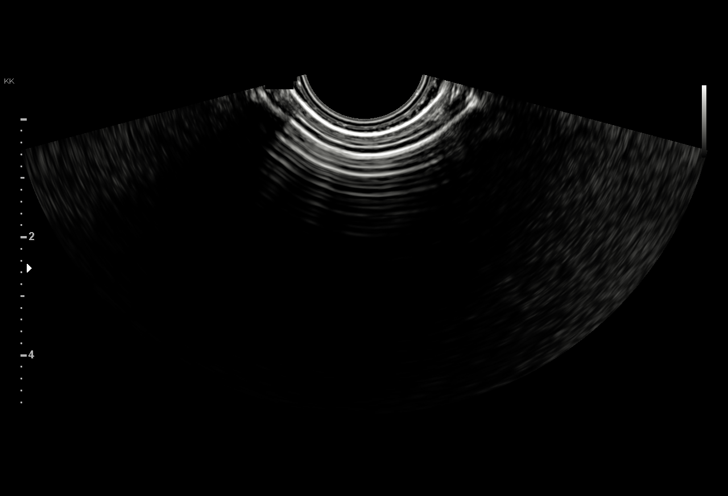
[im 27/35]
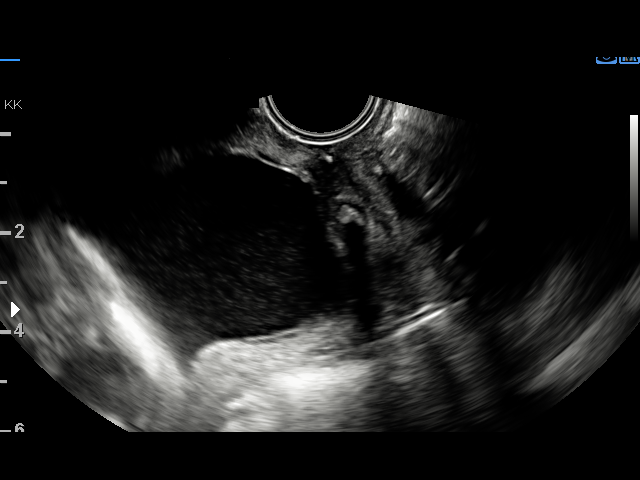
[im 29/35]
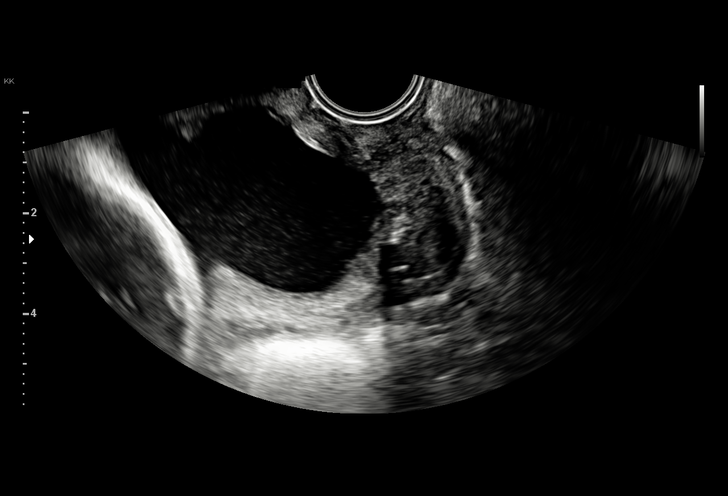
[im 32/35]
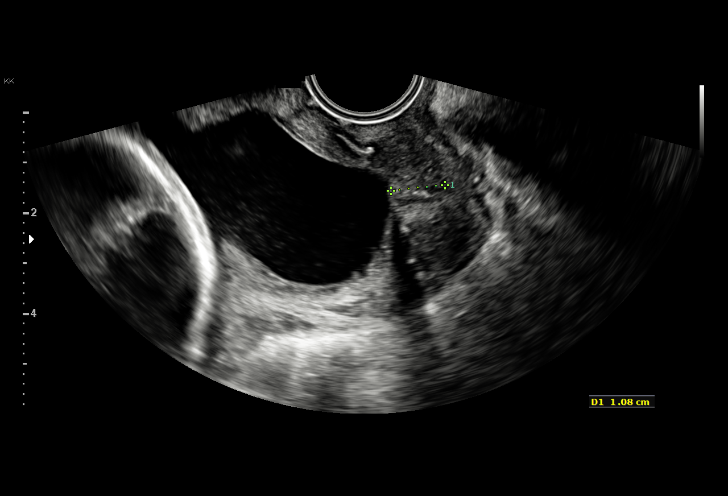
[im 35/35]
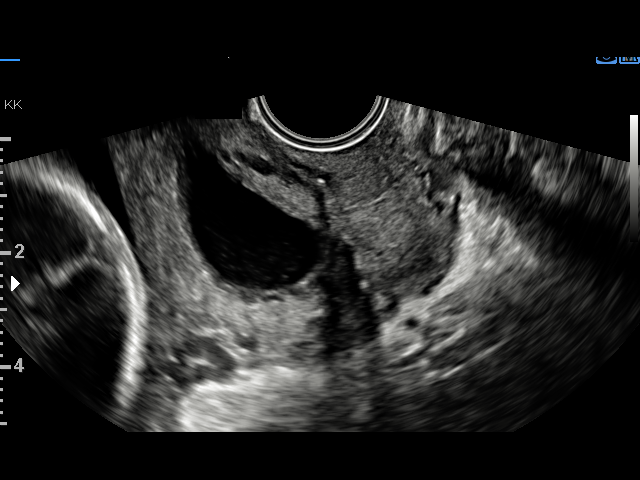

[15 of 28 positions shown; findings below may reference images not displayed]

Name:       SORIN OXENDINE              Visit Date:  04/29/2016 [DATE]

[facility]
MAU/Triage

1  MYRTILL NIEDER          [id number]      [id number]     [id number]
2  MYRTILL NIEDER          [id number]      [phone]     [id number]
Indications

26 weeks gestation of pregnancy
Vaginal bleeding in pregnancy, second
trimester
Poor obstetric history: Previous preterm
delivery, antepartum
Cervical incompetence, second trimester
Cervical cerclage suture present, second
trimester - placed 14 weeks
OB History

Gravidity:    4         Term:   1        Prem:   1        SAB:   2
Living:       1
Fetal Evaluation

Num Of Fetuses:     1
Fetal Heart         141
Rate(bpm):
Cardiac Activity:   Observed
Presentation:       Cephalic
Placenta:           Anterior, above cervical os

Amniotic Fluid
AFI FV:      Subjectively within normal limits
Largest Pocket(cm)
3.8
Gestational Age

Clinical EDD:  26w 0d                                        EDD:   08/05/16
Best:          26w 0d    Det. By:   Clinical EDD             EDD:   08/05/16
Cervix Uterus Adnexa

Cervix
Length:            1.3  cm.
Cerclage visualized. Measured transvaginally. Funneling of internal
os noted.
Impression

Single IUP at 26w 0d
Cervical insufficiency s/p cerclage
Limited ultrasound performed due to vaginal bleeding
Cephalic presentation
Anterior placenta without previa
Normal amniotic fluid volume

TVUS - cervical length 1.2 cm with U-shaped funneling that
extends to the cerclage stitch
Some cervical debris is noted.
Recommendations

Follow-up ultrasounds as clinically indicated.

## 2018-12-13 ENCOUNTER — Encounter: Payer: Self-pay | Admitting: Podiatry

## 2018-12-13 ENCOUNTER — Other Ambulatory Visit: Payer: Self-pay

## 2018-12-13 ENCOUNTER — Ambulatory Visit: Payer: BC Managed Care – PPO | Admitting: Podiatry

## 2018-12-13 VITALS — Temp 97.9°F | Resp 16

## 2018-12-13 DIAGNOSIS — B351 Tinea unguium: Secondary | ICD-10-CM | POA: Diagnosis not present

## 2018-12-13 NOTE — Progress Notes (Signed)
   Subjective:    Patient ID: Natalie Dunn, female    DOB: Jun 15, 1991, 28 y.o.   MRN: [id number]  HPI 28 year old female presents the office today for concerns of possible toenail fungus.  She is noticed some thickening discoloration to the tip of both of her big toenails which started in March.  No injury.  No pain.  Denies any redness or drainage or any swelling.  She is tried over-the-counter antifungal medicines without any significant improvement.  She has no other concerns today.   Review of Systems  All other systems reviewed and are negative.  Past Medical History:  Diagnosis Date  . SVD (spontaneous vaginal delivery) 11/2012   x 1  . SVD (spontaneous vaginal delivery) 06/26/2016    Past Surgical History:  Procedure Laterality Date  . CERVICAL CERCLAGE N/A 09/19/2012   Procedure: CERCLAGE CERVICAL;  Surgeon: Oliver Pila, MD;  Location: [facility];  Service: Gynecology;  Laterality: N/A;  . CERVICAL CERCLAGE N/A 02/08/2016   Procedure: CERCLAGE CERVICAL;  Surgeon: Huel Cote, MD;  Location: [facility];  Service: Gynecology;  Laterality: N/A;    No current outpatient medications on file.  No Known Allergies       Objective:   Physical Exam  General: AAO x3, NAD  Dermatological: To the bilateral hallux the nails appear to be mild discoloration to the distal aspects of the toenails with mild yellow to Mirelez discoloration.  There is no pain.  There is no redness or drainage or swelling.  No signs of infection.  No open lesions.  Vascular: Dorsalis Pedis artery and Posterior Tibial artery pedal pulses are 2/4 bilateral with immedate capillary fill time.  There is no pain with calf compression, swelling, warmth, erythema.   Neruologic: Grossly intact via light touch bilateral. Vibratory intact via tuning fork bilateral. Protective threshold with Semmes Wienstein monofilament intact to all pedal sites bilateral. Patellar and Achilles deep tendon reflexes 2+ bilateral.  No Babinski or clonus noted bilateral.   Musculoskeletal: No gross boney pedal deformities bilateral. No pain, crepitus, or limitation noted with foot and ankle range of motion bilateral. Muscular strength 5/5 in all groups tested bilateral.  Gait: Unassisted, Nonantalgic.     Assessment & Plan:  28 year old female with onychodystrophy, likely onychomycosis -Treatment options discussed including all alternatives, risks, and complications -Etiology of symptoms were discussed -She can start a biotin supplement. Also discussed natural treatments for nail fungus in the meantime.  -I debrided the bilateral hallux toenails and sent this for culture to [facility] labs.  We discussed treatment options for nail fungus however we will await the results of the culture before proceeding with definitive treatment.  Vivi Barrack DPM

## 2018-12-13 NOTE — Patient Instructions (Signed)
You can start a biotin supplement to help with your toenails Soaking in warm water with a small amount of apple cider vinegar daily can also help. Apply a small amount of tea tree oil to the nails.

## 2019-01-01 ENCOUNTER — Telehealth: Payer: Self-pay | Admitting: *Deleted

## 2019-01-01 NOTE — Telephone Encounter (Signed)
-----   Message from Trula Slade, DPM sent at 01/01/2019  7:06 AM EDT ----- Val- please let her know that the culture did not show fungus. I would conitnue with the biotin supplement to help the nails grow, tea tree oil topically. If she wanted we can still do a topical antifungal as it could be a false negative. I would do the [employer] compound but in the absence of fungus it may not be overly helpful (except the urea and tea tree oil that is in it). Her nails were not very thick so I would not do a high percentage urea for her.

## 2019-01-01 NOTE — Telephone Encounter (Signed)
I informed pt of Dr. Wagoner's review of results and orders. Pt states understanding. 

## 2019-07-12 NOTE — L&D Delivery Note (Addendum)
Delivery Note  Pt rechecked on L/D and reconfirmed to be completely dilated with irregular contractions. NICU team at bedside. She pushed for about and at 11:59 AM a viable female was delivered via Vaginal, Spontaneous (Presentation: LOA     ).  APGAR: ( per NICU)  weight 3 lb 5.6 oz (1520 g).   Anterior and posterior shoulders delivered with next push then body followed. Great tone and spontaneous cry, pink at delivery After a minute delay, the cord was clamped and cut by patient  Placenta status: Delivered at the 20-41minute mark; Intact. Shultz  Cord: 3 vessels with the following complications: None.  Cord pH: none  Anesthesia: None Episiotomy: None Lacerations: None Suture Repair: n/a Est. Blood Loss (mL): 154  Mom to postpartum.  Baby to NICU  Pt will desire circumcision for baby . Continue on procardia 60xl qd; check preE labs, monitor BPs  Janean Sark Emilee Market 05/05/2020, 12:27 PM

## 2019-12-02 LAB — OB RESULTS CONSOLE HEPATITIS B SURFACE ANTIGEN: Hepatitis B Surface Ag: NEGATIVE

## 2019-12-02 LAB — OB RESULTS CONSOLE HIV ANTIBODY (ROUTINE TESTING): HIV: NONREACTIVE

## 2019-12-02 LAB — OB RESULTS CONSOLE RUBELLA ANTIBODY, IGM: Rubella: IMMUNE

## 2019-12-02 LAB — OB RESULTS CONSOLE RPR: RPR: NONREACTIVE

## 2019-12-25 ENCOUNTER — Telehealth (HOSPITAL_COMMUNITY): Payer: Self-pay | Admitting: *Deleted

## 2019-12-25 ENCOUNTER — Encounter (HOSPITAL_COMMUNITY): Payer: Self-pay | Admitting: *Deleted

## 2019-12-25 NOTE — Telephone Encounter (Signed)
Preadmission screen  

## 2020-01-01 ENCOUNTER — Other Ambulatory Visit (HOSPITAL_COMMUNITY)
Admission: RE | Admit: 2020-01-01 | Discharge: 2020-01-01 | Disposition: A | Discharge status: Home / Self Care | Payer: BC Managed Care – PPO | Source: Ambulatory Visit | Attending: Obstetrics and Gynecology | Admitting: Obstetrics and Gynecology

## 2020-01-01 DIAGNOSIS — Z20822 Contact with and (suspected) exposure to covid-19: Secondary | ICD-10-CM | POA: Diagnosis not present

## 2020-01-01 DIAGNOSIS — Z01812 Encounter for preprocedural laboratory examination: Secondary | ICD-10-CM | POA: Insufficient documentation

## 2020-01-01 LAB — SARS CORONAVIRUS 2 (TAT 6-24 HRS): SARS Coronavirus 2: NEGATIVE

## 2020-01-02 NOTE — H&P (Signed)
Natalie Dunn is a 29 y.o. female [id number] at 5 0/7 weeks (EDD 07/10/20 by 6 week [place] and unknown LMP) presenting for scheduled cerclage with h/o incompetent cervix.    OB History    Gravida  6   Para  2   Term  0   Preterm  2   AB  3   Living  2     SAB  2   TAB  0   Ectopic  0   Multiple  0   Live Births  2         2014 G1--Prior 27 weeks delivery after placement of rescue cerclage at 20 weeks 2017 G2-Prophylactic cerclage in first trimester with funneling to cerclage at 20 weeks, weekly 17-P.  Had  PROM at 29 weeks and delivered at 34 weeks  Past Medical History:  Diagnosis Date  . SVD (spontaneous vaginal delivery) 11/2012   x 1  . SVD (spontaneous vaginal delivery) 06/26/2016   Past Surgical History:  Procedure Laterality Date  . CERVICAL CERCLAGE N/A 09/19/2012   Procedure: CERCLAGE CERVICAL;  Surgeon: Oliver Pila, MD;  Location: [facility];  Service: Gynecology;  Laterality: N/A;  . CERVICAL CERCLAGE N/A 02/08/2016   Procedure: CERCLAGE CERVICAL;  Surgeon: Huel Cote, MD;  Location: [facility];  Service: Gynecology;  Laterality: N/A;   Family History: family history includes Diabetes in her father and mother; Hypertension in her father and mother. Social History:  reports that she has quit smoking. She has never used smokeless tobacco. She reports that she does not drink alcohol and does not use drugs.     Review of Systems  Constitutional: Negative for fever.  Gastrointestinal: Negative for abdominal pain.   History   unknown if currently breastfeeding. Maternal Exam:  Abdomen: Patient reports no abdominal tenderness. Introitus: Normal vulva. Normal vagina.    Physical Exam  Cardiovascular: Normal rate and regular rhythm.  Respiratory: Effort normal and breath sounds normal.  GI: Soft. There is no abdominal tenderness.  Genitourinary:    Vulva normal.   Neurological: She is alert.  Psychiatric: Mood normal.    Prenatal labs: ABO, Rh:  O  pos Antibody:  neg Rubella:  Immune RPR:   NR HBsAg:   Neg HIV:   NR   Assessment/Plan:  Reviewed cerclage procedure as well as risks and benefits with pt in detail including bleeding, infection and possible ROM leading to loss of pregnancy.  Ready to proceed.  d/w pt 17-P vs vaginal progesterone and will start vaginal progesterone at 16 weeks   Oliver Pila 01/02/2020, 8:58 PM

## 2020-01-03 ENCOUNTER — Inpatient Hospital Stay (HOSPITAL_COMMUNITY): Payer: BC Managed Care – PPO | Admitting: Anesthesiology

## 2020-01-03 ENCOUNTER — Encounter (HOSPITAL_COMMUNITY)
Admission: RE | Disposition: A | Discharge status: Home / Self Care | Payer: Self-pay | Source: Ambulatory Visit | Attending: Obstetrics and Gynecology

## 2020-01-03 ENCOUNTER — Encounter (HOSPITAL_COMMUNITY): Payer: Self-pay | Admitting: Obstetrics and Gynecology

## 2020-01-03 ENCOUNTER — Other Ambulatory Visit: Payer: Self-pay

## 2020-01-03 ENCOUNTER — Ambulatory Visit (HOSPITAL_COMMUNITY)
Admission: RE | Admit: 2020-01-03 | Discharge: 2020-01-03 | Disposition: A | Discharge status: Home / Self Care | Payer: BC Managed Care – PPO | Source: Ambulatory Visit | Attending: Obstetrics and Gynecology | Admitting: Obstetrics and Gynecology

## 2020-01-03 DIAGNOSIS — Z87891 Personal history of nicotine dependence: Secondary | ICD-10-CM | POA: Diagnosis not present

## 2020-01-03 DIAGNOSIS — Z3A13 13 weeks gestation of pregnancy: Secondary | ICD-10-CM | POA: Insufficient documentation

## 2020-01-03 DIAGNOSIS — O3431 Maternal care for cervical incompetence, first trimester: Secondary | ICD-10-CM | POA: Diagnosis present

## 2020-01-03 DIAGNOSIS — O99211 Obesity complicating pregnancy, first trimester: Secondary | ICD-10-CM | POA: Insufficient documentation

## 2020-01-03 HISTORY — PX: CERVICAL CERCLAGE: SHX1329

## 2020-01-03 LAB — TYPE AND SCREEN
ABO/RH(D): O POS
Antibody Screen: NEGATIVE

## 2020-01-03 LAB — CBC
HCT: 36.8 % (ref 36.0–46.0)
Hemoglobin: 11.8 g/dL — ABNORMAL LOW (ref 12.0–15.0)
MCH: 28.1 pg (ref 26.0–34.0)
MCHC: 32.1 g/dL (ref 30.0–36.0)
MCV: 87.6 fL (ref 80.0–100.0)
Platelets: 228 10*3/uL (ref 150–400)
RBC: 4.2 MIL/uL (ref 3.87–5.11)
RDW: 12.5 % (ref 11.5–15.5)
WBC: 8.3 10*3/uL (ref 4.0–10.5)
nRBC: 0 % (ref 0.0–0.2)

## 2020-01-03 SURGERY — CERCLAGE, CERVIX, VAGINAL APPROACH
Anesthesia: Spinal

## 2020-01-03 MED ORDER — KETOROLAC TROMETHAMINE 30 MG/ML IJ SOLN: 30.0000 mg | Freq: Once | INTRAMUSCULAR | Status: DC | PRN

## 2020-01-03 MED ORDER — ONDANSETRON HCL 4 MG/2ML IJ SOLN
INTRAMUSCULAR | Status: DC | PRN
  Administered 2020-01-03: 4 mg via INTRAVENOUS

## 2020-01-03 MED ORDER — HYDROMORPHONE HCL 1 MG/ML IJ SOLN: 0.2500 mg | INTRAMUSCULAR | Status: DC | PRN

## 2020-01-03 MED ORDER — LACTATED RINGERS IV SOLN: INTRAVENOUS | Status: DC | PRN

## 2020-01-03 MED ORDER — CHLOROPROCAINE HCL (PF) 3 % IJ SOLN
INTRAMUSCULAR | Status: DC | PRN
  Administered 2020-01-03: 1.8 mL

## 2020-01-03 MED ORDER — LACTATED RINGERS IV SOLN: INTRAVENOUS | Status: DC

## 2020-01-03 MED ORDER — FENTANYL CITRATE (PF) 100 MCG/2ML IJ SOLN
INTRAMUSCULAR | Status: DC | PRN
  Administered 2020-01-03: 15 ug via INTRATHECAL

## 2020-01-03 MED ORDER — OXYCODONE HCL 5 MG/5ML PO SOLN: 5.0000 mg | Freq: Once | ORAL | Status: DC | PRN

## 2020-01-03 MED ORDER — FENTANYL CITRATE (PF) 100 MCG/2ML IJ SOLN
INTRAMUSCULAR | Status: AC
  Filled 2020-01-03: qty 2

## 2020-01-03 MED ORDER — CHLOROPROCAINE HCL (PF) 3 % IJ SOLN
INTRAMUSCULAR | Status: AC
  Filled 2020-01-03: qty 20

## 2020-01-03 MED ORDER — PROMETHAZINE HCL 25 MG/ML IJ SOLN: 6.2500 mg | INTRAMUSCULAR | Status: DC | PRN

## 2020-01-03 MED ORDER — OXYCODONE HCL 5 MG PO TABS: 5.0000 mg | ORAL_TABLET | Freq: Once | ORAL | Status: DC | PRN

## 2020-01-03 SURGICAL SUPPLY — 15 items
GLOVE BIO SURGEON STRL SZ 6.5 (GLOVE) ×4 IMPLANT
GLOVE BIO SURGEONS STRL SZ 6.5 (GLOVE) ×2
GOWN STRL REUS W/TWL LRG LVL3 (GOWN DISPOSABLE) ×6 IMPLANT
NEEDLE MAYO CATGUT SZ4 (NEEDLE) ×3 IMPLANT
NS IRRIG 1000ML POUR BTL (IV SOLUTION) ×3 IMPLANT
PACK VAGINAL MINOR WOMEN LF (CUSTOM PROCEDURE TRAY) ×3 IMPLANT
PAD OB MATERNITY 4.3X12.25 (PERSONAL CARE ITEMS) ×3 IMPLANT
PAD PREP 24X48 CUFFED NSTRL (MISCELLANEOUS) ×3 IMPLANT
SUT POLYDEK 5 CE 75 36 (SUTURE) ×3 IMPLANT
SYR BULB IRRIGATION 50ML (SYRINGE) ×3 IMPLANT
TOWEL OR 17X24 6PK STRL BLUE (TOWEL DISPOSABLE) ×6 IMPLANT
TRAY FOLEY W/BAG SLVR 14FR (SET/KITS/TRAYS/PACK) ×3 IMPLANT
TUBING NON-CON 1/4 X 20 CONN (TUBING) ×2 IMPLANT
TUBING NON-CON 1/4 X 20' CONN (TUBING) ×1
YANKAUER SUCT BULB TIP NO VENT (SUCTIONS) ×3 IMPLANT

## 2020-01-03 NOTE — Progress Notes (Signed)
Patient ID: Natalie Dunn, female   DOB: [birth year], 29 y.o.   MRN: [id number] Per pt no changes in dictated H&P.  Brief exam WNL.  Ready to proceed

## 2020-01-03 NOTE — Anesthesia Postprocedure Evaluation (Signed)
Anesthesia Post Note  Patient: Natalie Dunn  Procedure(s) Performed: CERCLAGE CERVICAL (N/A )     Patient location during evaluation: PACU Anesthesia Type: Spinal Level of consciousness: awake and alert Pain management: pain level controlled Vital Signs Assessment: post-procedure vital signs reviewed and stable Respiratory status: spontaneous breathing, nonlabored ventilation and respiratory function stable Cardiovascular status: blood pressure returned to baseline and stable Postop Assessment: no apparent nausea or vomiting Anesthetic complications: no   No complications documented.  Last Vitals:  Vitals:   01/03/20 1000 01/03/20 1015  BP: 112/84 113/80  Pulse: 70 66  Resp: 15 16  Temp:    SpO2: 100% 100%    Last Pain:  Vitals:   01/03/20 0914  TempSrc: Oral   Pain Goal:    LLE Motor Response: Purposeful movement (01/03/20 1015) LLE Sensation: Tingling (01/03/20 1015) RLE Motor Response: Purposeful movement (01/03/20 1015) RLE Sensation: Tingling (01/03/20 1015)     Epidural/Spinal Function Cutaneous sensation: Tingles (01/03/20 1015), Patient able to flex knees: Yes (01/03/20 1015), Patient able to lift hips off bed: Yes (01/03/20 1015), Back pain beyond tenderness at insertion site: No (01/03/20 1015), Progressively worsening motor and/or sensory loss: No (01/03/20 1015), Bowel and/or bladder incontinence post epidural: No (01/03/20 1015)  Lowella Curb

## 2020-01-03 NOTE — Anesthesia Preprocedure Evaluation (Signed)
Anesthesia Evaluation  Patient identified by MRN, date of birth, ID band Patient awake    Reviewed: Allergy & Precautions, NPO status , Patient's Chart, lab work & pertinent test results  Airway Mallampati: I  TM Distance: >3 FB Neck ROM: Full    Dental  (+) Teeth Intact   Pulmonary former smoker,    breath sounds clear to auscultation       Cardiovascular negative cardio ROS   Rhythm:Regular Rate:Normal     Neuro/Psych negative neurological ROS     GI/Hepatic negative GI ROS, Neg liver ROS,   Endo/Other  negative endocrine ROS  Renal/GU negative Renal ROS     Musculoskeletal   Abdominal (+) + obese,   Peds  Hematology negative hematology ROS (+)   Anesthesia Other Findings   Reproductive/Obstetrics (+) Pregnancy                             Lab Results  Component Value Date   WBC 8.3 01/03/2020   HGB 11.8 (L) 01/03/2020   HCT 36.8 01/03/2020   MCV 87.6 01/03/2020   PLT 228 01/03/2020     Anesthesia Physical  Anesthesia Plan  ASA: II  Anesthesia Plan: Spinal   Post-op Pain Management:    Induction:   PONV Risk Score and Plan: 2 and Treatment may vary due to age or medical condition  Airway Management Planned: Natural Airway  Additional Equipment:   Intra-op Plan:   Post-operative Plan:   Informed Consent: I have reviewed the patients History and Physical, chart, labs and discussed the procedure including the risks, benefits and alternatives for the proposed anesthesia with the patient or authorized representative who has indicated his/her understanding and acceptance.       Plan Discussed with: CRNA  Anesthesia Plan Comments:         Anesthesia Quick Evaluation

## 2020-01-03 NOTE — Op Note (Signed)
Operative Note    Preoperative Diagnosis Incompetent cervix with preterm delivery x 2  Postoperative Diagnosis Same   Procedure Cervical Cerclage  Surgeon Huel Cote, MD  Anesthesia Spinal  Fluids: EBL 74mL UOP clear IVF LR  Findings Cervix was multiparous, but long and closed  Specimen none  Procedure Note The patient was taken to the operating room where spinal anesthesia was obtained without difficulty.  She was then prepped and draped in a normal sterile fashion in the dorsal lithotomy position.  An appropriate timeout was performed.  A weighted speculum was placed in the vagina as well as two deaver retractors to expose the cervix.  A pursestring suture of 0 polydek was then placed circumferentially around the cervix.   The knot was tied at 12 o'clock.  At the conclusion of the procedure the vagina was irrigated and no active bleeding noted.  The patient was taken to the recovery room in good condition and FHT's auscultated there were WNL.

## 2020-01-03 NOTE — Anesthesia Procedure Notes (Signed)
Spinal  Patient location during procedure: OB Start time: 01/03/2020 8:30 AM End time: 01/03/2020 8:35 AM Staffing Performed: anesthesiologist  Anesthesiologist: Lowella Curb, MD Preanesthetic Checklist Completed: patient identified, IV checked, risks and benefits discussed, surgical consent, monitors and equipment checked, pre-op evaluation and timeout performed Spinal Block Patient position: sitting Prep: DuraPrep and site prepped and draped Patient monitoring: heart rate, cardiac monitor, continuous pulse ox and blood pressure Approach: midline Location: L3-4 Injection technique: single-shot Needle Needle type: Pencan  Needle gauge: 24 G Needle length: 10 cm Assessment Sensory level: T4

## 2020-01-03 NOTE — Transfer of Care (Signed)
Immediate Anesthesia Transfer of Care Note  Patient: Natalie Dunn  Procedure(s) Performed: CERCLAGE CERVICAL (N/A )  Patient Location: PACU  Anesthesia Type:Spinal  Level of Consciousness: awake and alert   Airway & Oxygen Therapy: Patient Spontanous Breathing  Post-op Assessment: Report given to RN and Post -op Vital signs reviewed and stable  Post vital signs: Reviewed  Last Vitals:  Vitals Value Taken Time  BP 101/67 01/03/20 0914  Temp 36.8 C 01/03/20 0914  Pulse 69 01/03/20 0916  Resp 14 01/03/20 0916  SpO2 100 % 01/03/20 0916  Vitals shown include unvalidated device data.  Last Pain:  Vitals:   01/03/20 0914  TempSrc: Oral         Complications: No complications documented.

## 2020-01-04 ENCOUNTER — Encounter (HOSPITAL_COMMUNITY): Payer: Self-pay | Admitting: Obstetrics and Gynecology

## 2020-03-23 ENCOUNTER — Encounter: Payer: Self-pay | Admitting: *Deleted

## 2020-03-23 ENCOUNTER — Ambulatory Visit (INDEPENDENT_AMBULATORY_CARE_PROVIDER_SITE_OTHER): Payer: BC Managed Care – PPO | Admitting: *Deleted

## 2020-03-23 ENCOUNTER — Other Ambulatory Visit: Payer: Self-pay

## 2020-03-23 VITALS — BP 125/85 | HR 79

## 2020-03-23 DIAGNOSIS — O26879 Cervical shortening, unspecified trimester: Secondary | ICD-10-CM | POA: Diagnosis not present

## 2020-03-23 MED ORDER — BETAMETHASONE SOD PHOS & ACET 6 (3-3) MG/ML IJ SUSP
12.0000 mg | INTRAMUSCULAR | Status: AC
  Administered 2020-03-23: 12 mg via INTRAMUSCULAR

## 2020-03-23 NOTE — Progress Notes (Signed)
Betamethasone 12 mg IM administered by Jeananne Rama, RN as scheduled. Pt tolerated well. Pt will have 2nd injection tomorrow.

## 2020-03-24 ENCOUNTER — Ambulatory Visit (INDEPENDENT_AMBULATORY_CARE_PROVIDER_SITE_OTHER): Payer: BC Managed Care – PPO | Admitting: *Deleted

## 2020-03-24 VITALS — BP 120/81 | HR 94 | Ht 67.0 in | Wt 254.0 lb

## 2020-03-24 DIAGNOSIS — O26879 Cervical shortening, unspecified trimester: Secondary | ICD-10-CM | POA: Diagnosis not present

## 2020-03-24 MED ORDER — BETAMETHASONE SOD PHOS & ACET 6 (3-3) MG/ML IJ SUSP
12.0000 mg | Freq: Once | INTRAMUSCULAR | Status: AC
  Administered 2020-03-24: 12 mg via INTRAMUSCULAR

## 2020-03-24 NOTE — Progress Notes (Signed)
Patient seen and assessed by nursing staff.  Agree with documentation and plan.  

## 2020-03-24 NOTE — Progress Notes (Signed)
Here for second betamethosone.  FHR 143. No complaints . Tolerated injection without complaint. Pt. Will follow up with her ob provider as scheduled. Hether Anselmo,RN

## 2020-04-20 ENCOUNTER — Encounter (HOSPITAL_COMMUNITY): Payer: Self-pay | Admitting: Obstetrics and Gynecology

## 2020-04-20 ENCOUNTER — Other Ambulatory Visit: Payer: Self-pay

## 2020-04-20 ENCOUNTER — Inpatient Hospital Stay (HOSPITAL_COMMUNITY)
Admission: AD | Admit: 2020-04-20 | Discharge: 2020-05-07 | DRG: 768 | Disposition: A | Discharge status: Home / Self Care | Payer: Medicaid Other | Attending: Obstetrics and Gynecology | Admitting: Obstetrics and Gynecology

## 2020-04-20 DIAGNOSIS — O42919 Preterm premature rupture of membranes, unspecified as to length of time between rupture and onset of labor, unspecified trimester: Principal | ICD-10-CM

## 2020-04-20 DIAGNOSIS — Z20822 Contact with and (suspected) exposure to covid-19: Secondary | ICD-10-CM | POA: Diagnosis present

## 2020-04-20 DIAGNOSIS — Z3A28 28 weeks gestation of pregnancy: Secondary | ICD-10-CM | POA: Diagnosis not present

## 2020-04-20 DIAGNOSIS — O3433 Maternal care for cervical incompetence, third trimester: Secondary | ICD-10-CM | POA: Diagnosis present

## 2020-04-20 DIAGNOSIS — Z87891 Personal history of nicotine dependence: Secondary | ICD-10-CM

## 2020-04-20 DIAGNOSIS — O42913 Preterm premature rupture of membranes, unspecified as to length of time between rupture and onset of labor, third trimester: Principal | ICD-10-CM | POA: Diagnosis present

## 2020-04-20 DIAGNOSIS — O134 Gestational [pregnancy-induced] hypertension without significant proteinuria, complicating childbirth: Secondary | ICD-10-CM | POA: Diagnosis present

## 2020-04-20 MED ORDER — ACETAMINOPHEN 325 MG PO TABS: 650.0000 mg | ORAL_TABLET | ORAL | Status: DC | PRN

## 2020-04-20 MED ORDER — CALCIUM CARBONATE ANTACID 500 MG PO CHEW: 2.0000 | CHEWABLE_TABLET | ORAL | Status: DC | PRN

## 2020-04-20 MED ORDER — PRENATAL MULTIVITAMIN CH
1.0000 | ORAL_TABLET | Freq: Every day | ORAL | Status: DC
  Administered 2020-04-21 – 2020-04-27 (×7): 1 via ORAL
  Filled 2020-04-20 (×8): qty 1

## 2020-04-20 MED ORDER — ZOLPIDEM TARTRATE 5 MG PO TABS: 5.0000 mg | ORAL_TABLET | Freq: Every evening | ORAL | Status: DC | PRN

## 2020-04-20 MED ORDER — DOCUSATE SODIUM 100 MG PO CAPS
100.0000 mg | ORAL_CAPSULE | Freq: Every day | ORAL | Status: DC
  Administered 2020-04-24 – 2020-04-28 (×5): 100 mg via ORAL
  Filled 2020-04-20 (×8): qty 1

## 2020-04-20 NOTE — H&P (Addendum)
Natalie Dunn is a 29 y.o. female [id number] at 26+ with PPROM.  SROM at 10pm, clear fluid.  Has h/o incompetent cervix, cerclage placement, PPROM and PTD delivery x 2.  Pt seen in office today was cl/80/-3, had elevated BP - reassuring NST also glucola.  Checked PIH labs.  Labs pending. Rechecked in MAU.   EDC 07/10/20 by early [place].  Has been using vaginal progesterone will change to oral progesterone.  Will also treat with amp/zithro for latency.  Has received BMZ 9/13 and 9/14, will give 2nd course. Has been getting pv progesterone - will change to po.  Will get MFM US/consult; NICU consult in AM.    OB History    Gravida  7   Para  2   Term  0   Preterm  2   AB  4   Living  2     SAB  2   TAB  0   Ectopic  0   Multiple  0   Live Births  2         SAB x 3; TAB x 1 27+6 SVD after rescue cerclage at 20 wk, PPROM 34+2 SVD after cerclage, PPROM at 29wk, 4#5 female G7 current  Cerclage at 13 weeks (McDonald knot at 12 o'clock)  At 22wk funneling to cerclage  Past Medical History:  Diagnosis Date  . SVD (spontaneous vaginal delivery) 11/2012   x 1  . SVD (spontaneous vaginal delivery) 06/26/2016   Past Surgical History:  Procedure Laterality Date  . CERVICAL CERCLAGE N/A 09/19/2012   Procedure: CERCLAGE CERVICAL;  Surgeon: Oliver Pila, MD;  Location: [facility];  Service: Gynecology;  Laterality: N/A;  . CERVICAL CERCLAGE N/A 02/08/2016   Procedure: CERCLAGE CERVICAL;  Surgeon: Huel Cote, MD;  Location: [facility];  Service: Gynecology;  Laterality: N/A;  . CERVICAL CERCLAGE N/A 01/03/2020   Procedure: CERCLAGE CERVICAL;  Surgeon: Huel Cote, MD;  Location: [facility];  Service: Gynecology;  Laterality: N/A;   Family History: family history includes Diabetes in her father and mother; Hypertension in her father and mother. Social History:  reports that she has quit smoking. She has never used smokeless tobacco. She reports that she does not drink alcohol and does  not use drugs. Meds: progesterone and PNV All: NKDA    Maternal Diabetes: No - glucola pending Genetic Screening: Normal Maternal Ultrasounds/Referrals: Normal Fetal Ultrasounds or other Referrals:  None Maternal Substance Abuse:  No Significant Maternal Medications:  Meds include: Other: progesterone PV Significant Maternal Lab Results:  Other:  unknown Other Comments:  BMZ 9/13, 9/14; nl panorama; cerclage 6/21; h/o PPROM and PTD  Review of Systems  Constitutional: Negative.   HENT: Negative.   Eyes: Negative.   Respiratory: Negative.   Cardiovascular: Negative.   Gastrointestinal: Negative.   Genitourinary: Negative.   Musculoskeletal: Negative.   Skin: Negative.   Neurological: Negative.   Psychiatric/Behavioral: Negative.    Maternal Medical History:  Reason for admission: Rupture of membranes.   Contractions: Frequency: rare.    Fetal activity: Perceived fetal activity is normal.    Prenatal complications: H/o PTD x 2, cerclage x 3; PPROM x 3 Elevated BP  Prenatal Complications - Diabetes: none.      Blood pressure 129/83, pulse 83, SpO2 98 %, unknown if currently breastfeeding. Maternal Exam:  Uterine Assessment: Contraction frequency is irregular.   Abdomen: Patient reports no abdominal tenderness. Fundal height is appropriate for gestation.    Introitus: Normal vulva. Normal vagina.  Ferning test: positive.  Nitrazine test: positive.     Physical Exam Constitutional:      Appearance: Normal appearance. She is obese.  HENT:     Head: Normocephalic and atraumatic.  Cardiovascular:     Rate and Rhythm: Normal rate and regular rhythm.  Pulmonary:     Effort: Pulmonary effort is normal.     Breath sounds: Normal breath sounds.  Abdominal:     General: Bowel sounds are normal.     Palpations: Abdomen is soft.     Comments: GRAVID, NT  Genitourinary:    General: Normal vulva.  Musculoskeletal:        General: Normal range of motion.      Cervical back: Normal range of motion and neck supple.  Skin:    General: Skin is warm and dry.  Neurological:     General: No focal deficit present.     Mental Status: She is alert and oriented to person, place, and time.  Psychiatric:        Mood and Affect: Mood normal.        Behavior: Behavior normal.     Prenatal labs: ABO, Rh: --/--/O POS (06/25 0730) Antibody: NEG (06/25 0730) Rubella:  immune RPR:   NR HBsAg:   neg HIV:   neg GBS:   unknown  Hgb 13.2/Plt 284/Ur Cx neg/GC neg/Chl neg/Varicella immune/Hgb electro WNL  PIH labs and glucola P NIPT low risk  [place] to date, nl NT Nl anat, female  22wk [place] - CL unmeasurable, funneling to cerclage - received BMZ  BS US shows transverse presentation  Assessment/Plan: 29yo [id number] at 28+ with PPROM S/p BMZ x 2 Amp and Erythro for latency MFM US/consult in AM NICU consult in AM, NICU aware  GBBS  Oral progesterone instead of PV D/w pt briefly, if labors will need c/s for delivery due to presentation  Natalie Dunn 04/20/2020, 11:49 PM

## 2020-04-20 NOTE — MAU Provider Note (Signed)
History     CSN: [id number]  Arrival date and time: 04/20/20 2226   First Provider Initiated Contact with Patient 04/20/20 2302      Chief Complaint  Patient presents with  . Rupture of Membranes   Natalie Dunn is a 29 y.o. year old [phone] female at [redacted]w[redacted]d weeks gestation who presents to MAU reporting she started leaking clear fluid 10:00 PM.  She was seen in the office today by Dr. Ellyn Hack.  Her blood pressure was elevated in the office today.  She was prescribed medication for high blood pressure but did not have a chance to pick it up from 8 AM.  She has a history of preterm deliveries, received betamethasone x2 doses in September 2021, she has a cerclage in place.  She denies contractions, vaginal bleeding, pain.  She reports positive fetal movement all day today.  She receives her prenatal care at [facility].   OB History    Gravida  6   Para  2   Term  0   Preterm  2   AB  3   Living  2     SAB  2   TAB  0   Ectopic  0   Multiple  0   Live Births  2           Past Medical History:  Diagnosis Date  . SVD (spontaneous vaginal delivery) 11/2012   x 1  . SVD (spontaneous vaginal delivery) 06/26/2016    Past Surgical History:  Procedure Laterality Date  . CERVICAL CERCLAGE N/A 09/19/2012   Procedure: CERCLAGE CERVICAL;  Surgeon: Oliver Pila, MD;  Location: [facility];  Service: Gynecology;  Laterality: N/A;  . CERVICAL CERCLAGE N/A 02/08/2016   Procedure: CERCLAGE CERVICAL;  Surgeon: Huel Cote, MD;  Location: [facility];  Service: Gynecology;  Laterality: N/A;  . CERVICAL CERCLAGE N/A 01/03/2020   Procedure: CERCLAGE CERVICAL;  Surgeon: Huel Cote, MD;  Location: [facility];  Service: Gynecology;  Laterality: N/A;    Family History  Problem Relation Age of Onset  . Diabetes Mother   . Hypertension Mother   . Diabetes Father   . Hypertension Father     Social History   Tobacco Use  . Smoking status: Former Games developer  .  Smokeless tobacco: Never Used  Vaping Use  . Vaping Use: Never used  Substance Use Topics  . Alcohol use: No  . Drug use: No    Allergies: No Known Allergies  Medications Prior to Admission  Medication Sig Dispense Refill Last Dose  . Prenatal Vit-Fe Fumarate-FA (PRENATAL PO) Take 1 tablet by mouth at bedtime.   04/19/2020 at Unknown time  . progesterone (PROMETRIUM) 200 MG capsule progesterone micronized 200 mg capsule  INSERT 1 CAPSULE VAGINALLY DAILY AT BEDTIME   04/19/2020 at Unknown time    Review of Systems  Constitutional: Negative.   HENT: Negative.   Eyes: Negative.   Respiratory: Negative.   Cardiovascular: Negative.   Gastrointestinal: Negative.   Endocrine: Negative.   Genitourinary: Positive for vaginal discharge (started leaking fluid @ 2200).  Musculoskeletal: Negative.   Skin: Negative.   Allergic/Immunologic: Negative.   Neurological: Negative.   Hematological: Negative.   Psychiatric/Behavioral: Negative.    Physical Exam   Patient Vitals for the past 24 hrs:  BP Temp Temp src Pulse Resp SpO2 Height Weight  04/21/20 0349 119/78 98.1 F (36.7 C) Oral 93 19 99 % -- --  04/20/20 2351 118/77 98.5 F (  36.9 C) Oral 76 20 100 % -- --  04/20/20 2349 -- -- -- -- -- -- 5\' 7"  (1.702 m) 123.9 kg  04/20/20 2331 129/83 -- -- 83 -- -- -- --  04/20/20 2330 -- -- -- -- -- 98 % -- --  04/20/20 2258 (!) 134/92 -- -- 87 -- -- -- --  04/20/20 2245 (!) 142/92 -- -- 97 -- 98 % -- --    Physical Exam Vitals and nursing note reviewed.  Constitutional:      Appearance: Normal appearance. She is obese.  HENT:     Head: Normocephalic and atraumatic.     Nose: Nose normal.  Eyes:     Pupils: Pupils are equal, round, and reactive to light.  Cardiovascular:     Rate and Rhythm: Normal rate.     Pulses: Normal pulses.  Pulmonary:     Effort: Pulmonary effort is normal.  Genitourinary:    Comments: Deferred, fern(+) for PPROM Musculoskeletal:        General: Normal  range of motion.     Cervical back: Normal range of motion.  Skin:    General: Skin is warm and dry.  Neurological:     Mental Status: She is alert and oriented to person, place, and time.  Psychiatric:        Mood and Affect: Mood normal.        Behavior: Behavior normal.        Thought Content: Thought content normal.        Judgment: Judgment normal.    NST - FHR: 145 bpm / moderate variability / accels present / decels absent / TOCO: none   MAU Course  Procedures  MDM CCUA CBC CMP P/C Ratio Serial BP's   Results for orders placed or performed during the hospital encounter of 04/20/20 (from the past 24 hour(s))  CBC     Status: Abnormal   Collection Time: 04/20/20 11:24 PM  Result Value Ref Range   WBC 7.7 4.0 - 10.5 K/uL   RBC 3.85 (L) 3.87 - 5.11 MIL/uL   Hemoglobin 10.8 (L) 12.0 - 15.0 g/dL   HCT 06/20/20 (L) 36 - 46 %   MCV 85.7 80.0 - 100.0 fL   MCH 28.1 26.0 - 34.0 pg   MCHC 32.7 30.0 - 36.0 g/dL   RDW 36.6 44.0 - 34.7 %   Platelets 254 150 - 400 K/uL   nRBC 0.0 0.0 - 0.2 %  Comprehensive metabolic panel     Status: Abnormal   Collection Time: 04/20/20 11:24 PM  Result Value Ref Range   Sodium 136 135 - 145 mmol/L   Potassium 3.5 3.5 - 5.1 mmol/L   Chloride 106 98 - 111 mmol/L   CO2 21 (L) 22 - 32 mmol/L   Glucose, Bld 84 70 - 99 mg/dL   BUN 6 6 - 20 mg/dL   Creatinine, Ser 06/20/20 0.44 - 1.00 mg/dL   Calcium 9.2 8.9 - 9.56 mg/dL   Total Protein 6.0 (L) 6.5 - 8.1 g/dL   Albumin 2.6 (L) 3.5 - 5.0 g/dL   AST 21 15 - 41 U/L   ALT 18 0 - 44 U/L   Alkaline Phosphatase 46 38 - 126 U/L   Total Bilirubin <0.1 (L) 0.3 - 1.2 mg/dL   GFR, Estimated 38.7 >56 mL/min   Anion gap 9 5 - 15  Protein / creatinine ratio, urine     Status: None   Collection Time: 04/20/20 11:24 PM  Result Value Ref Range   Creatinine, Urine 252.15 mg/dL   Total Protein, Urine 24 mg/dL   Protein Creatinine Ratio 0.10 0.00 - 0.15 mg/mg[Cre]  Type and screen [facility]      Status: None   Collection Time: 04/20/20 11:24 PM  Result Value Ref Range   ABO/RH(D) O POS    Antibody Screen NEG    Sample Expiration      04/23/2020,2359 Performed at [facility], [address], [place], [place] [address]   Respiratory Panel by RT PCR (Flu A&B, Covid) - Nasopharyngeal Swab     Status: None   Collection Time: 04/20/20 11:35 PM   Specimen: Nasopharyngeal Swab  Result Value Ref Range   SARS Coronavirus 2 by RT PCR NEGATIVE NEGATIVE   Influenza A by PCR NEGATIVE NEGATIVE   Influenza B by PCR NEGATIVE NEGATIVE     Assessment and Plan  Preterm premature rupture of membranes (PPROM) with unknown onset of labor - Plan: US OB Comp + 14 Wk, US OB Comp + 14 Wk - Admit to [employer] - Dr. Ellyn Hack in to see and assume care of patient @ 1145 - See Dr. Emeline Darling H&P documentation  Raelyn Mora, MSN, CNM 04/20/2020, 11:02 PM

## 2020-04-20 NOTE — MAU Note (Signed)
.  Natalie Dunn is a 29 y.o. at [redacted]w[redacted]d here in MAU reporting: Possible SROM clear fluid at 2200. Patient has hx of preterm deliveries has had BMZ x2 in September. Cerclage in place. Endorses good fetal movement. Denies ctx and VB.   Pain score: 0  FHT:155

## 2020-04-21 ENCOUNTER — Inpatient Hospital Stay: Payer: Medicaid Other

## 2020-04-21 DIAGNOSIS — O42913 Preterm premature rupture of membranes, unspecified as to length of time between rupture and onset of labor, third trimester: Secondary | ICD-10-CM | POA: Diagnosis present

## 2020-04-21 DIAGNOSIS — O99213 Obesity complicating pregnancy, third trimester: Secondary | ICD-10-CM | POA: Diagnosis not present

## 2020-04-21 DIAGNOSIS — Z87891 Personal history of nicotine dependence: Secondary | ICD-10-CM | POA: Diagnosis not present

## 2020-04-21 DIAGNOSIS — O42113 Preterm premature rupture of membranes, onset of labor more than 24 hours following rupture, third trimester: Secondary | ICD-10-CM

## 2020-04-21 DIAGNOSIS — E669 Obesity, unspecified: Secondary | ICD-10-CM | POA: Diagnosis not present

## 2020-04-21 DIAGNOSIS — O3433 Maternal care for cervical incompetence, third trimester: Secondary | ICD-10-CM | POA: Diagnosis present

## 2020-04-21 DIAGNOSIS — O09213 Supervision of pregnancy with history of pre-term labor, third trimester: Secondary | ICD-10-CM | POA: Diagnosis not present

## 2020-04-21 DIAGNOSIS — Z3A28 28 weeks gestation of pregnancy: Secondary | ICD-10-CM | POA: Diagnosis not present

## 2020-04-21 DIAGNOSIS — Z20822 Contact with and (suspected) exposure to covid-19: Secondary | ICD-10-CM | POA: Diagnosis present

## 2020-04-21 DIAGNOSIS — O134 Gestational [pregnancy-induced] hypertension without significant proteinuria, complicating childbirth: Secondary | ICD-10-CM | POA: Diagnosis present

## 2020-04-21 LAB — TYPE AND SCREEN
ABO/RH(D): O POS
Antibody Screen: NEGATIVE

## 2020-04-21 LAB — COMPREHENSIVE METABOLIC PANEL
ALT: 18 U/L (ref 0–44)
AST: 21 U/L (ref 15–41)
Albumin: 2.6 g/dL — ABNORMAL LOW (ref 3.5–5.0)
Alkaline Phosphatase: 46 U/L (ref 38–126)
Anion gap: 9 (ref 5–15)
BUN: 6 mg/dL (ref 6–20)
CO2: 21 mmol/L — ABNORMAL LOW (ref 22–32)
Calcium: 9.2 mg/dL (ref 8.9–10.3)
Chloride: 106 mmol/L (ref 98–111)
Creatinine, Ser: 0.68 mg/dL (ref 0.44–1.00)
GFR, Estimated: >60 mL/min (ref >60)
Glucose, Bld: 84 mg/dL (ref 70–99)
Potassium: 3.5 mmol/L (ref 3.5–5.1)
Sodium: 136 mmol/L (ref 135–145)
Total Bilirubin: <0.1 mg/dL — ABNORMAL LOW (ref 0.3–1.2)
Total Protein: 6 g/dL — ABNORMAL LOW (ref 6.5–8.1)

## 2020-04-21 LAB — CBC
HCT: 33 % — ABNORMAL LOW (ref 36.0–46.0)
Hemoglobin: 10.8 g/dL — ABNORMAL LOW (ref 12.0–15.0)
MCH: 28.1 pg (ref 26.0–34.0)
MCHC: 32.7 g/dL (ref 30.0–36.0)
MCV: 85.7 fL (ref 80.0–100.0)
Platelets: 254 10*3/uL (ref 150–400)
RBC: 3.85 MIL/uL — ABNORMAL LOW (ref 3.87–5.11)
RDW: 13.5 % (ref 11.5–15.5)
WBC: 7.7 10*3/uL (ref 4.0–10.5)
nRBC: 0 % (ref 0.0–0.2)

## 2020-04-21 LAB — RESPIRATORY PANEL BY RT PCR (FLU A&B, COVID)
Influenza A by PCR: NEGATIVE
Influenza B by PCR: NEGATIVE
SARS Coronavirus 2 by RT PCR: NEGATIVE

## 2020-04-21 LAB — PROTEIN / CREATININE RATIO, URINE
Creatinine, Urine: 252.15 mg/dL
Protein Creatinine Ratio: 0.1 mg/mg{Cre} (ref 0.00–0.15)
Total Protein, Urine: 24 mg/dL

## 2020-04-21 LAB — OB RESULTS CONSOLE GBS: GBS: NEGATIVE

## 2020-04-21 MED ORDER — PRENATAL MULTIVITAMIN CH: 1.0000 | ORAL_TABLET | Freq: Every day | ORAL | Status: DC

## 2020-04-21 MED ORDER — PROGESTERONE 200 MG PO CAPS
200.0000 mg | ORAL_CAPSULE | Freq: Every day | ORAL | Status: DC
  Administered 2020-04-21: 200 mg via ORAL
  Filled 2020-04-21 (×3): qty 1

## 2020-04-21 MED ORDER — LACTATED RINGERS IV SOLN: INTRAVENOUS | Status: DC

## 2020-04-21 MED ORDER — AZITHROMYCIN 250 MG PO TABS
1000.0000 mg | ORAL_TABLET | Freq: Once | ORAL | Status: AC
  Administered 2020-04-21: 1000 mg via ORAL
  Filled 2020-04-21: qty 4

## 2020-04-21 MED ORDER — SODIUM CHLORIDE 0.9 % IV SOLN
2.0000 g | Freq: Four times a day (QID) | INTRAVENOUS | Status: AC
  Administered 2020-04-21 – 2020-04-22 (×8): 2 g via INTRAVENOUS
  Filled 2020-04-21 (×8): qty 2000

## 2020-04-21 MED ORDER — DOCUSATE SODIUM 100 MG PO CAPS: 100.0000 mg | ORAL_CAPSULE | Freq: Every day | ORAL | Status: DC

## 2020-04-21 MED ORDER — BETAMETHASONE SOD PHOS & ACET 6 (3-3) MG/ML IJ SUSP
12.0000 mg | INTRAMUSCULAR | Status: AC
  Administered 2020-04-21 – 2020-04-22 (×2): 12 mg via INTRAMUSCULAR
  Filled 2020-04-21: qty 5

## 2020-04-21 MED ORDER — AMOXICILLIN 500 MG PO CAPS
500.0000 mg | ORAL_CAPSULE | Freq: Three times a day (TID) | ORAL | Status: AC
  Administered 2020-04-22 – 2020-04-27 (×15): 500 mg via ORAL
  Filled 2020-04-21 (×15): qty 1

## 2020-04-21 MED ORDER — CALCIUM CARBONATE ANTACID 500 MG PO CHEW: 2.0000 | CHEWABLE_TABLET | ORAL | Status: DC | PRN

## 2020-04-21 MED ORDER — ZOLPIDEM TARTRATE 5 MG PO TABS: 5.0000 mg | ORAL_TABLET | Freq: Every evening | ORAL | Status: DC | PRN

## 2020-04-21 NOTE — Progress Notes (Signed)
Patient ID: Natalie Dunn, female   DOB: [birth year], 29 y.o.   MRN: [id number] HD#2 Pt reports no complaints this am. Still appreciating LOF but no cramping/contractions. +Fms. Pt spoke to Neonatologist this am  VS: 131/71, 92, 19, 98.5 GEN - NAD EFM - 145, good variability, reassuring TOCO - no contractions SVE - deferred  NL LFTs, upr/cr: 0.10,  7.7>10.8<254 SARs covid neg, infulenza neg GBS pending  A/P: 53ZJ [id number] at 28 4/[redacted]wks gestation with PPROM, ? ghtn         - Continue on latency antibx; switch to oral meds tomorrow         - Receiving rescue course of BMZ - dose given at 1am today         - Plan to have MFM consult and scan today: transverse lie on admission - discussed implications for delivery. Not candidate for version given PPROM and preterm. Will need C/S if stays in same position         - Pt is NOT taking procardia as was unable to pick up yesterday. BP currently stable - will monitor for now         - Continue oral progesterone

## 2020-04-21 NOTE — Consult Note (Addendum)
MFM Note  Natalie Dunn is a 29 year old gravida 7 para 2 currently at 28 weeks and 4 days.  She was seen in consultation due to PPROM.  The patient was admitted last night after spontaneously rupturing membranes.  She has a history of an incompetent cervix.  She had a cervical cerclage placed in her current pregnancy at around 13 weeks.  At around 22 weeks, cervical funneling all the way down to the cerclage stitch was noted.  Due to her funneled cervix, the patient was started on vaginal progesterone at that time and received an initial course of antenatal corticosteroids on September 13 and 14, 2021.    Her cervix was examined in the office yesterday and was found to be closed and 80% effaced.  The patient has a history of 2 prior pregnancies where she spontaneously ruptured membranes and had preterm deliveries.  Since being admitted to the hospital, the patient reports that she has continued to leak fluid through the vagina.  Her fetal heart rate tracing has been reactive.  No contractions are noted on the toco.  She was given the first dose of rescue steroids and started on latency antibiotics.  Due to PPROM, she was switched from vaginal progesterone to daily oral progesterone.  She had a cell free DNA test drawn earlier in her current pregnancy which indicated a low risk for trisomy 19, 54, and 13.  Her past obstetrical history includes: -spontaneous abortions x3 -termination of pregnancy x1 -vaginal delivery at 27 weeks and 6 days following PPROM -vaginal delivery at 34 weeks and 2 days following PPROM at 29 weeks.  Medical history- denies any significant past medical history Surgical history- cerclage placement x3  The usual management and implications of PPROM were discussed with the patient.  She was advised that due to rupture of membranes, she will require inpatient management until delivery with daily fetal testing.  She should receive a rescue course of antenatal corticosteroids and  complete a course of latency antibiotics.  She was advised that due to PPROM, delivery will be recommended at around 34 weeks.  Delivery prior to this time will be indicated should she go into spontaneous labor, should she show any signs of an intrauterine infection, or at any time for nonreassuring fetal status.   Should she require delivery before 32 weeks, magnesium sulfate should be given for fetal neuro protection.  As she will already have received 2 courses (initial and rescue course) of antenatal corticosteroids, no further steroid courses are recommended.  As she is currently stable and does not show any signs of an intrauterine infection or contractions, I would keep her cervical cerclage stitch in place for now.  Her cerclage stitch should be removed should she have frequent contractions or show any signs of an intrauterine infection.  As oral progesterone has not been studied or has been demonstrated to show any effects on the management of preterm labor or PPROM, the oral progesterone may be discontinued.  The patient was reassured that most babies delivered at around 34 weeks following PPROM generally do well.  However, her baby will require a NICU admission following delivery.  As the patient has already experienced PPROM in her two prior pregnancies resulting in live births, she states that she understands and agrees with the management plan.   At the end of the consultation, the patient stated that all her questions had been answered to her complete satisfaction.    Thank you for referring this patient for Maternal-Fetal M  edicine consultation.   Recommendations: -Inpatient management until delivery -Daily fetal testing -Weekly ultrasounds to check amniotic fluid and fetal position -Latency antibiotics -Discontinue oral progesterone -Magnesium sulfate for fetal neuro protection should she require delivery before 32 weeks -No further steroid courses are recommended after the  rescue course -Maintain cervical cerclage stitch with removal for delivery, recurrent contractions, or signs of an intrauterine infection -Delivery at around 34 weeks or earlier should she go into spontaneous labor, should she show any signs of an intrauterine infection, or at any time for nonreassuring fetal status  Total consult time 65 minutes

## 2020-04-21 NOTE — Progress Notes (Signed)
Patient ID: Natalie Dunn, female   DOB: 09-Sep-1990, 29 y.o.   MRN: [id number]  Chart check :    BP 117/73, 81  [place] scheduled for tomorrow am  Dr Blake Divine in to see pt today  Continue current care plan

## 2020-04-21 NOTE — Consult Note (Signed)
Asked by Dr Hinton Rao to speak to Natalie Dunn due to premature ROM at 28 weeks.   Natalie Dunn's OB hx is notable for previous 2 preterm births at 41 and 34 wks. Both of these children are doing well per hx. Currently, she has a cerclage and had a spont ROM on 10/11. She has received betamethasone on 9/13-14 and is on progesterone. Her GBS screen is pending.  I spoke to Natalie Dunn regarding clinical picture if infant had to be born in the next few days. I discussed RDS, its treatment and support. I discussed nutritional support both by HAL and enteral feedings. I reminded her of the benefits of providing breast milk. I talked about screening for complications associated with prematurity such as IVH and ROP. I also talked about estimated LOS.  Thank you for this consult.   Lucillie Garfinkel MD Neonatologist  I spent 25 min on this consult. >50% of the time was spent face to face.

## 2020-04-21 NOTE — Progress Notes (Signed)
Patient ID: Natalie Dunn, female   DOB: 1990-09-03, 29 y.o.   MRN: [id number] Late entry   Discussed with MFM and will continue plan as agreed.   Will however stop oral progesterone  Pt with no complaints.   Pt comfortable with plan of care

## 2020-04-22 ENCOUNTER — Inpatient Hospital Stay (HOSPITAL_BASED_OUTPATIENT_CLINIC_OR_DEPARTMENT_OTHER): Payer: Medicaid Other

## 2020-04-22 DIAGNOSIS — O09213 Supervision of pregnancy with history of pre-term labor, third trimester: Secondary | ICD-10-CM

## 2020-04-22 DIAGNOSIS — O99213 Obesity complicating pregnancy, third trimester: Secondary | ICD-10-CM | POA: Diagnosis not present

## 2020-04-22 DIAGNOSIS — O42913 Preterm premature rupture of membranes, unspecified as to length of time between rupture and onset of labor, third trimester: Secondary | ICD-10-CM | POA: Diagnosis not present

## 2020-04-22 DIAGNOSIS — Z3A28 28 weeks gestation of pregnancy: Secondary | ICD-10-CM

## 2020-04-22 DIAGNOSIS — O3433 Maternal care for cervical incompetence, third trimester: Secondary | ICD-10-CM | POA: Diagnosis not present

## 2020-04-22 DIAGNOSIS — E669 Obesity, unspecified: Secondary | ICD-10-CM

## 2020-04-22 DIAGNOSIS — Z363 Encounter for antenatal screening for malformations: Secondary | ICD-10-CM

## 2020-04-22 NOTE — Progress Notes (Addendum)
A/P NOTE  S: +FM, denies VB. Clear small leakage of fluid, denies abdominal pain or cramping. Denies fevers, chills. Aware of ultrasound scheduled for today. Reviewed normal 1hr GTT in office from Monday (130). NO complaints, voiding without issue, ambulating without pain to bathroom  O: BP 108/73 (BP Location: Left Arm)   Pulse 84   Temp 98.5 F (36.9 C) (Oral)   Resp 18   Ht 5\' 7"  (1.702 m)   Wt 123.9 kg   SpO2 98%   BMI 42.78 kg/m  Gen: AAF in NAD CV: CTAB, RRR Abd: Gravid, NTTP MSK: Neg calf edema/Homan's BL  AM NST: Reactive, neg TOCO activity  A/P: This is a 29yo @ 38 5/7 HD#3 admitted with PPROM. H/o cervical incompetency s/p cerclage 6/25, PTD (27 6/7, 34 2/7). Doing well this AM  1) PPROM w/ h/o CI -Continue latency antibiotics as ordered, VSS today -Cerclage in place, knot at 12 o clock, funneling to cerclage at 22wks    *If laboring, will move to L&D, plan to cut suture at that time  2) IUP @ 28 5/7 -NST q-shift -S/p 2 courses of BMTZ (rescue 10/12-13) -1hr GTT completed in office WNL (130 10/11)    *If labor moves forward, already aware she will require MgSO4 for NP if <[redacted]wks EGA  Plan for Delivery at 34wks if no labor prior to this S/p MFM and NICU consults.  MFM scan this AM

## 2020-04-23 LAB — CBC
HCT: 29.7 % — ABNORMAL LOW (ref 36.0–46.0)
Hemoglobin: 9.4 g/dL — ABNORMAL LOW (ref 12.0–15.0)
MCH: 27.3 pg (ref 26.0–34.0)
MCHC: 31.6 g/dL (ref 30.0–36.0)
MCV: 86.3 fL (ref 80.0–100.0)
Platelets: 225 10*3/uL (ref 150–400)
RBC: 3.44 MIL/uL — ABNORMAL LOW (ref 3.87–5.11)
RDW: 13.7 % (ref 11.5–15.5)
WBC: 9.6 10*3/uL (ref 4.0–10.5)
nRBC: 0 % (ref 0.0–0.2)

## 2020-04-23 LAB — CULTURE, BETA STREP (GROUP B ONLY)

## 2020-04-23 LAB — TYPE AND SCREEN
ABO/RH(D): O POS
Antibody Screen: NEGATIVE

## 2020-04-23 MED ORDER — POLYSACCHARIDE IRON COMPLEX 150 MG PO CAPS
150.0000 mg | ORAL_CAPSULE | Freq: Every day | ORAL | Status: DC
  Administered 2020-04-23 – 2020-04-28 (×6): 150 mg via ORAL
  Filled 2020-04-23 (×6): qty 1

## 2020-04-23 NOTE — Plan of Care (Signed)
  Problem: Education: Goal: Knowledge of disease or condition will improve Outcome: Completed/Met Goal: Knowledge of the prescribed therapeutic regimen will improve Outcome: Completed/Met   Problem: Education: Goal: Knowledge of General Education information will improve Description: Including pain rating scale, medication(s)/side effects and non-pharmacologic comfort measures Outcome: Completed/Met   Problem: Activity: Goal: Risk for activity intolerance will decrease Outcome: Completed/Met   Problem: Nutrition: Goal: Adequate nutrition will be maintained Outcome: Completed/Met   Problem: Coping: Goal: Level of anxiety will decrease Outcome: Completed/Met   Problem: Elimination: Goal: Will not experience complications related to bowel motility Outcome: Completed/Met Goal: Will not experience complications related to urinary retention Outcome: Completed/Met   Problem: Clinical Measurements: Goal: Respiratory complications will improve Outcome: Not Applicable

## 2020-04-23 NOTE — Progress Notes (Signed)
HD #4, [redacted]W[redacted]D, PPROM, cerclage Doing well, still leaks some, no VB, no ctx, +FM Afeb, VSS FHT- appropriate for EGA Fundus NT U/s-vtx, EFW 11%ile, 1125 gms, oligohydramnios  Will continue latency antibiotics and observation, MFM rec BPP and fluid check in one week

## 2020-04-24 NOTE — Progress Notes (Signed)
Patient ID: Natalie Dunn, female   DOB: May 16, 1991, 29 y.o.   MRN: [id number] HD#5 29 0/7 weeks PPROM, incompetent cervix, cerclage  Feeling good, no contractions, leaking small amounts of fluid Good FM  afeb VSS Gravid NT  Continues latency abx, last dose in 3 days GBS negative VTX on last [place] 04/22/20--next BPP/AFI with MFM due 04/29/20 Continue current care

## 2020-04-25 NOTE — Progress Notes (Signed)
Patient ID: Natalie Dunn, female   DOB: 1991-02-17, 29 y.o.   MRN: [id number] HD#5 29 1/7 weeks PPROM, incompetent cervix, cerclage  No ctx, some LOF Feels well  afeb VSS FHR reactive on tracings q shift     Finishes latency antibiotics in 2 days Vtx on last [place] 04/22/20  BPP/AFI planned with MFM 04/29/20 GBS negative S/p betamethasone 03/23/20-03/24/20 and 04/21/20

## 2020-04-26 LAB — TYPE AND SCREEN
ABO/RH(D): O POS
Antibody Screen: NEGATIVE

## 2020-04-26 NOTE — Progress Notes (Signed)
Patient ID: Natalie Dunn, female   DOB: 1991-01-17, 30 y.o.   MRN: [id number] HD #6  PPROM at 29 2/7 weeks, cerclage  Pt reports no contractions, light LOF, good FM Had nice visit with family yesterday just emotionally hard  afeb VSS FHR category 1 q shift     Finishes latency antibiotics tomorrow Vtx on last [place] 04/22/20  BPP/AFI planned with MFM 04/29/20 GBS negative S/p betamethasone 03/23/20-03/24/20 and 04/21/20

## 2020-04-27 LAB — CBC
HCT: 32.1 % — ABNORMAL LOW (ref 36.0–46.0)
Hemoglobin: 10.3 g/dL — ABNORMAL LOW (ref 12.0–15.0)
MCH: 27.5 pg (ref 26.0–34.0)
MCHC: 32.1 g/dL (ref 30.0–36.0)
MCV: 85.6 fL (ref 80.0–100.0)
Platelets: 229 10*3/uL (ref 150–400)
RBC: 3.75 MIL/uL — ABNORMAL LOW (ref 3.87–5.11)
RDW: 13.7 % (ref 11.5–15.5)
WBC: 8.5 10*3/uL (ref 4.0–10.5)
nRBC: 0 % (ref 0.0–0.2)

## 2020-04-27 MED ORDER — SODIUM CHLORIDE 0.9% FLUSH
3.0000 mL | Freq: Two times a day (BID) | INTRAVENOUS | Status: DC
  Administered 2020-04-27 – 2020-04-28 (×3): 3 mL via INTRAVENOUS

## 2020-04-27 NOTE — Progress Notes (Signed)
A/P NOTE  S: +FM, denies VB. Clear small leakage of fluid, denies abdominal pain or cramping. Denies fevers, chills. No complaints, voiding without issue, ambulating without pain to bathroom  O: BP (!) 148/84 (BP Location: Right Arm)   Pulse 71   Temp 98.3 F (36.8 C) (Oral)   Resp 18   Ht 5\' 7"  (1.702 m)   Wt 123.9 kg   SpO2 97%   BMI 42.78 kg/m  Gen: AAF in NAD CV: CTAB, RRR Abd: Gravid, NTTP MSK: Neg calf edema/Homan's BL  AM NST: Reactive, neg TOCO activity  A/P: This is a 29yo @ 29 3/7 HD#7 admitted with PPROM. H/o cervical incompetency s/p cerclage 6/25, PTD (27 6/7, 34 2/7). Doing well this AM  1) PPROM w/ h/o CI -Continue latency antibiotics as ordered, VSS today    *Last dose today -Cerclage in place, knot at 12 o clock, funneling to cerclage at 22wks    *If laboring, will move to L&D, plan to cut suture at that time  2) IUP @ 29 3/7 -NST q-shift -S/p 2 courses of BMTZ (rescue 10/12-13) -If labor moves forward, already aware she will require MgSO4 for NP if <[redacted]wks EGA -T&S active, rpt CBC today WNL (10.3/32.1/229)  Plan for Delivery at 34wks if no labor prior to this S/p MFM and NICU consults.

## 2020-04-28 ENCOUNTER — Encounter (HOSPITAL_COMMUNITY): Payer: Self-pay | Admitting: Obstetrics and Gynecology

## 2020-04-28 MED ORDER — LACTATED RINGERS IV SOLN: INTRAVENOUS | Status: DC

## 2020-04-28 MED ORDER — LACTATED RINGERS IV SOLN: 500.0000 mL | INTRAVENOUS | Status: DC | PRN

## 2020-04-28 MED ORDER — ACETAMINOPHEN 325 MG PO TABS: 650.0000 mg | ORAL_TABLET | ORAL | Status: DC | PRN

## 2020-04-28 MED ORDER — SOD CITRATE-CITRIC ACID 500-334 MG/5ML PO SOLN: 30.0000 mL | ORAL | Status: DC | PRN

## 2020-04-28 MED ORDER — LACTATED RINGERS IV SOLN
INTRAVENOUS | Status: DC
  Administered 2020-04-29: 75 mL/h via INTRAVENOUS

## 2020-04-28 MED ORDER — MAGNESIUM SULFATE 40 GM/1000ML IV SOLN
2.0000 g/h | INTRAVENOUS | Status: DC
  Filled 2020-04-28: qty 1000

## 2020-04-28 MED ORDER — OXYCODONE-ACETAMINOPHEN 5-325 MG PO TABS: 2.0000 | ORAL_TABLET | ORAL | Status: DC | PRN

## 2020-04-28 MED ORDER — MAGNESIUM SULFATE BOLUS VIA INFUSION
6.0000 g | Freq: Once | INTRAVENOUS | Status: AC
  Administered 2020-04-28: 6 g via INTRAVENOUS
  Filled 2020-04-28: qty 1000

## 2020-04-28 MED ORDER — LIDOCAINE HCL (PF) 1 % IJ SOLN: 30.0000 mL | INTRAMUSCULAR | Status: DC | PRN

## 2020-04-28 MED ORDER — OXYTOCIN BOLUS FROM INFUSION: 333.0000 mL | Freq: Once | INTRAVENOUS | Status: DC

## 2020-04-28 MED ORDER — LACTATED RINGERS IV BOLUS
500.0000 mL | Freq: Once | INTRAVENOUS | Status: AC
  Administered 2020-04-28: 500 mL via INTRAVENOUS

## 2020-04-28 MED ORDER — ONDANSETRON HCL 4 MG/2ML IJ SOLN: 4.0000 mg | Freq: Four times a day (QID) | INTRAMUSCULAR | Status: DC | PRN

## 2020-04-28 MED ORDER — OXYCODONE-ACETAMINOPHEN 5-325 MG PO TABS: 1.0000 | ORAL_TABLET | ORAL | Status: DC | PRN

## 2020-04-28 MED ORDER — OXYTOCIN-SODIUM CHLORIDE 30-0.9 UT/500ML-% IV SOLN: 2.5000 [IU]/h | INTRAVENOUS | Status: DC

## 2020-04-28 MED ORDER — BUTORPHANOL TARTRATE 1 MG/ML IJ SOLN: 1.0000 mg | INTRAMUSCULAR | Status: DC | PRN

## 2020-04-28 NOTE — Progress Notes (Signed)
Patient ID: Natalie Dunn, female   DOB: Jan 05, 1991, 29 y.o.   MRN: [id number] I was called and notified that pt was reporting increased pelvic pressure and cramping.  I ordered a fluid bolus to see if would improve symptoms Pt on monitor .  After an hour, symptoms reported back as persisting and contractions confirmed on TOCO q 6-76mins  I arrived on the floor and performed a speculum and pelvic exam.  Cervix closed and stitch intact but pt reports increasing and persisting contractions Bedside US confirms vertex; head low in pelvis  Counseled on implications of removing cerclage Will start on MgSO4 as previously discussed for CP prophylaxis  Will move pt to L/D  FM - 150s, good variability

## 2020-04-28 NOTE — Progress Notes (Signed)
Patient ID: Natalie Dunn, female   DOB: 10/26/1990, 29 y.o.   MRN: [id number] Pt transferred to L/D and MgS04 started with a 6gm bolus then continued at 2gm/hr. After 1-2 hrs pt reports contractions spacing out from q 4 to q 8-47mins No bleeding noted on pad  Cat 1 strip, 150s   Plan: will continue on MgS04 for 24hrs.            If contractions increase in intensity or decrease in timing, will remove cerclage           Continuous monitoring at this time

## 2020-04-28 NOTE — Progress Notes (Signed)
Patient ID: Ellwood Handler, female   DOB: 09-Sep-1990, 29 y.o.   MRN: [id number] Pt doing well. She reports increased pelvic pressure this am but not sustained- none at present. She denies contractions or change in LOF. +Fms. VSS: 115-148/64-85, 75 GEN - NAD ABD - cw gestational age 35 - no Homans  10/13: [place]- vertex 10/14: 9.6>9.4<225;   O pos  10/18: EFM - 150s, reactive TOCO - no contractions SVE - deferred  A/P: HD#9         29yo [id number] at 29 4/7wks with PPROM, Hx PTD for cervical incompetency - cerclage in place             - s/p latency antibiotics yesterday 10/18             - s/p BMZ on 9/13 and 9/14 with rescue dose on 04/21/20- no more per MFM             - cerclage in place             - pt to notify staff if pressure increases; will monitor via toco and check prn             - understands plan to transfer to L/D if labor occurs; likely need MgSO4 if <32weeks for CP prophylaxis             - continue NST twice daily             - to see MFM 04/29/20 for BPP             - GBS neg             - Plan to deliver at 34 weeks otherwise

## 2020-04-29 LAB — COMPREHENSIVE METABOLIC PANEL
ALT: 21 U/L (ref 0–44)
AST: 20 U/L (ref 15–41)
Albumin: 2.7 g/dL — ABNORMAL LOW (ref 3.5–5.0)
Alkaline Phosphatase: 55 U/L (ref 38–126)
Anion gap: 12 (ref 5–15)
BUN: <5 mg/dL — ABNORMAL LOW (ref 6–20)
CO2: 20 mmol/L — ABNORMAL LOW (ref 22–32)
Calcium: 7.9 mg/dL — ABNORMAL LOW (ref 8.9–10.3)
Chloride: 105 mmol/L (ref 98–111)
Creatinine, Ser: 0.74 mg/dL (ref 0.44–1.00)
GFR, Estimated: >60 mL/min (ref >60)
Glucose, Bld: 98 mg/dL (ref 70–99)
Potassium: 3.4 mmol/L — ABNORMAL LOW (ref 3.5–5.1)
Sodium: 137 mmol/L (ref 135–145)
Total Bilirubin: 0.6 mg/dL (ref 0.3–1.2)
Total Protein: 6 g/dL — ABNORMAL LOW (ref 6.5–8.1)

## 2020-04-29 LAB — PROTEIN / CREATININE RATIO, URINE
Creatinine, Urine: 130.98 mg/dL
Protein Creatinine Ratio: 0.11 mg/mg{Cre} (ref 0.00–0.15)
Total Protein, Urine: 14 mg/dL

## 2020-04-29 LAB — CBC
HCT: 33 % — ABNORMAL LOW (ref 36.0–46.0)
Hemoglobin: 10.7 g/dL — ABNORMAL LOW (ref 12.0–15.0)
MCH: 28.3 pg (ref 26.0–34.0)
MCHC: 32.4 g/dL (ref 30.0–36.0)
MCV: 87.3 fL (ref 80.0–100.0)
Platelets: 226 10*3/uL (ref 150–400)
RBC: 3.78 MIL/uL — ABNORMAL LOW (ref 3.87–5.11)
RDW: 14 % (ref 11.5–15.5)
WBC: 9.4 10*3/uL (ref 4.0–10.5)
nRBC: 0 % (ref 0.0–0.2)

## 2020-04-29 LAB — TYPE AND SCREEN
ABO/RH(D): O POS
Antibody Screen: NEGATIVE

## 2020-04-29 LAB — RPR: RPR Ser Ql: NONREACTIVE

## 2020-04-29 MED ORDER — SODIUM CHLORIDE 0.9% FLUSH
3.0000 mL | Freq: Two times a day (BID) | INTRAVENOUS | Status: DC
  Administered 2020-04-29: 3 mL via INTRAVENOUS

## 2020-04-29 MED ORDER — SODIUM CHLORIDE 0.9% FLUSH: 3.0000 mL | INTRAVENOUS | Status: DC | PRN

## 2020-04-29 MED ORDER — SODIUM CHLORIDE 0.9 % IV SOLN: 250.0000 mL | INTRAVENOUS | Status: DC | PRN

## 2020-04-29 MED ORDER — ZOLPIDEM TARTRATE 5 MG PO TABS: 5.0000 mg | ORAL_TABLET | Freq: Every evening | ORAL | Status: DC | PRN

## 2020-04-29 MED ORDER — CALCIUM CARBONATE ANTACID 500 MG PO CHEW: 2.0000 | CHEWABLE_TABLET | ORAL | Status: DC | PRN

## 2020-04-29 MED ORDER — PRENATAL MULTIVITAMIN CH
1.0000 | ORAL_TABLET | Freq: Every day | ORAL | Status: DC
  Administered 2020-04-29 – 2020-05-04 (×6): 1 via ORAL
  Filled 2020-04-29 (×6): qty 1

## 2020-04-29 MED ORDER — DOCUSATE SODIUM 100 MG PO CAPS
100.0000 mg | ORAL_CAPSULE | Freq: Every day | ORAL | Status: DC
  Administered 2020-04-29 – 2020-05-05 (×7): 100 mg via ORAL
  Filled 2020-04-29 (×7): qty 1

## 2020-04-29 NOTE — Progress Notes (Signed)
Patient ID: Natalie Dunn, female   DOB: 1990/09/28, 29 y.o.   MRN: [id number]  Pt with occasional/rare contractions.  Cont sm LOF, clear.  No VB, + FM  gen NAD 140's, mod var, + accels, category 1 toco rare Abd gravid NT  Transfer to [facility] specialty care S/p latency antibiotics S/p BMZ, x 2 courses

## 2020-04-29 NOTE — Progress Notes (Signed)
Patient ID: Natalie Dunn, female   DOB: October 05, 1990, 29 y.o.   MRN: [id number]  Pt feels good.  Cont sm fluid, no VB, +FM, some pelvic pressure. Nl PCR, nl LFTs, nl plts; no PIH sx's  AFVSS (BP140-150/80-90) gen NAD FHTs 140's, mod var, category 1 toco rare  Continue current mgmt, close monitoring

## 2020-04-29 NOTE — Progress Notes (Addendum)
Patient ID: Natalie Dunn, female   DOB: 1990-11-05, 29 y.o.   MRN: [id number]  PPROM 29+5 - s/p latency abx, BMZ x 2 courses  No c/o's.  +FM, small LOF, clear, no VB, no ctx; c/o vaginal pressure - constant, not coming and going.  No PIH sx's  AFVSS - BP 140-150/70-90 gen NAD FHTs 130-140's, + accels, (10x10, and 10x15); category 1 toco rare ctx Abd gravid, soft, NT  PPROM S/p latency abx S/p BMZ x 2 courses On L&D on Mg due to ctx, now have decreased, imperceptible to pt. Monitor closely Consider turning off Mg and transfer back to OB high risk  Spoke to MFM, OK to stop magenesium.  May retreat if threatens labor again before 32 weeks.   Turn off magnesium, reassess at lunch

## 2020-04-30 LAB — COMPREHENSIVE METABOLIC PANEL
ALT: 20 U/L (ref 0–44)
AST: 18 U/L (ref 15–41)
Albumin: 2.6 g/dL — ABNORMAL LOW (ref 3.5–5.0)
Alkaline Phosphatase: 58 U/L (ref 38–126)
Anion gap: 10 (ref 5–15)
BUN: <5 mg/dL — ABNORMAL LOW (ref 6–20)
CO2: 21 mmol/L — ABNORMAL LOW (ref 22–32)
Calcium: 8.2 mg/dL — ABNORMAL LOW (ref 8.9–10.3)
Chloride: 107 mmol/L (ref 98–111)
Creatinine, Ser: 0.69 mg/dL (ref 0.44–1.00)
GFR, Estimated: >60 mL/min (ref >60)
Glucose, Bld: 74 mg/dL (ref 70–99)
Potassium: 3.8 mmol/L (ref 3.5–5.1)
Sodium: 138 mmol/L (ref 135–145)
Total Bilirubin: 0.8 mg/dL (ref 0.3–1.2)
Total Protein: 5.7 g/dL — ABNORMAL LOW (ref 6.5–8.1)

## 2020-04-30 LAB — CBC
HCT: 34.2 % — ABNORMAL LOW (ref 36.0–46.0)
Hemoglobin: 10.9 g/dL — ABNORMAL LOW (ref 12.0–15.0)
MCH: 28.2 pg (ref 26.0–34.0)
MCHC: 31.9 g/dL (ref 30.0–36.0)
MCV: 88.4 fL (ref 80.0–100.0)
Platelets: 211 10*3/uL (ref 150–400)
RBC: 3.87 MIL/uL (ref 3.87–5.11)
RDW: 14.4 % (ref 11.5–15.5)
WBC: 8.6 10*3/uL (ref 4.0–10.5)
nRBC: 0 % (ref 0.0–0.2)

## 2020-04-30 MED ORDER — NIFEDIPINE ER OSMOTIC RELEASE 30 MG PO TB24
30.0000 mg | ORAL_TABLET | Freq: Once | ORAL | Status: AC
  Administered 2020-04-30: 30 mg via ORAL
  Filled 2020-04-30: qty 1

## 2020-04-30 MED ORDER — LACTATED RINGERS IV BOLUS
1000.0000 mL | Freq: Once | INTRAVENOUS | Status: AC
  Administered 2020-04-30: 1000 mL via INTRAVENOUS

## 2020-04-30 MED ORDER — LACTATED RINGERS IV SOLN: INTRAVENOUS | Status: DC

## 2020-04-30 MED ORDER — MAGNESIUM SULFATE 40 GM/1000ML IV SOLN
2.0000 g/h | INTRAVENOUS | Status: DC
  Administered 2020-04-30: 2 g/h via INTRAVENOUS
  Filled 2020-04-30: qty 1000

## 2020-04-30 MED ORDER — LABETALOL HCL 5 MG/ML IV SOLN: 80.0000 mg | INTRAVENOUS | Status: DC | PRN

## 2020-04-30 MED ORDER — HYDRALAZINE HCL 20 MG/ML IJ SOLN: 10.0000 mg | INTRAMUSCULAR | Status: DC | PRN

## 2020-04-30 MED ORDER — MAGNESIUM SULFATE BOLUS VIA INFUSION
4.0000 g | Freq: Once | INTRAVENOUS | Status: AC
  Administered 2020-04-30: 4 g via INTRAVENOUS
  Filled 2020-04-30: qty 1000

## 2020-04-30 MED ORDER — MAGNESIUM SULFATE 40 GM/1000ML IV SOLN: 2.0000 g/h | INTRAVENOUS | Status: DC

## 2020-04-30 MED ORDER — LABETALOL HCL 5 MG/ML IV SOLN
20.0000 mg | INTRAVENOUS | Status: DC | PRN
  Administered 2020-04-30: 20 mg via INTRAVENOUS
  Filled 2020-04-30: qty 4

## 2020-04-30 MED ORDER — LABETALOL HCL 5 MG/ML IV SOLN
40.0000 mg | INTRAVENOUS | Status: DC | PRN
  Administered 2020-04-30: 40 mg via INTRAVENOUS
  Filled 2020-04-30: qty 8

## 2020-04-30 MED ORDER — MAGNESIUM SULFATE 4 GM/100ML IV SOLN: 4.0000 g | Freq: Once | INTRAVENOUS | Status: DC

## 2020-04-30 NOTE — Progress Notes (Signed)
Patient ID: Natalie Dunn, female   DOB: 11-Nov-1990, 29 y.o.   MRN: [id number] Pt has been resting comfortably most of day.  She has had only a few contractions an hour, pressure a bit better since removing cerclage  +LOF  No HA or PIH sx  afeb Bp 154-171/92-109  FHR 130 with some occasional mild variables, good variability  Cervix exam deferred  29 6/7 weeks PPROM/cerclage removal/?gestational hypertension  S/p magnesium for about 12 hours for neuroprotection so will d/c BP elevated with normal PIH labs and prot:creat ration WNL yesterday, will start procardia XL 30mg  po q hs and follow closely.  Has required labetalol x 2  BP labile earlier this admission and then normalized S/p betamethasone Will allow to eat, but keep on L&D this PM to ensure does not start to labor again off the magnesium.

## 2020-04-30 NOTE — Progress Notes (Signed)
Patient ID: Natalie Dunn, female   DOB: 29-Sep-1990, 29 y.o.   MRN: [id number] Pt still feeling pressure and some mild contractions  Cerclage removed Cervix 90/3/0  Will follow to see if progresses Labs pending for [facility] w/u

## 2020-04-30 NOTE — Plan of Care (Signed)
IV infiltrated and removed so no IV access to push IV blood pressure meds. Blood pressure not taken again until IV access re-established and blood pressure was within normal limits at that time. No labetalol given.

## 2020-04-30 NOTE — Progress Notes (Signed)
Patient ID: Natalie Dunn, female   DOB: 1990-09-14, 29 y.o.   MRN: [id number] CTSP for increasing pressure and contractions  29 6/7 weeks PPROM s/p BMZ  Pt c/o contractions that restarted this AM about 230am.  She was restarted on magnesium for neuroprotection.  Currently feeling slightly less intense, but not resolved and pressure worse  afeb BP has been elevated intermittently but not consistently   SSE with copious fluid and possible dilation Sterile exam performed and cervix 90/1-2/0  D/w pt she is starting to dilate and I feel safest to remove the cerclage at this time Will transfer to L&D and remove, may likely continue to progress in labor NICU informed GBS negative on admission WIll get CBC and CMP, prot:creat ratio nml yesterday and no PIH sx

## 2020-04-30 NOTE — Progress Notes (Signed)
Initial Nutrition Assessment  DOCUMENTATION CODES:   Morbid obesity  INTERVENTION:  Regular diet Pt may order double protein portions and snacks TID if she makes request when ordering meals   NUTRITION DIAGNOSIS:   Increased nutrient needs related to  (pregnancy and fetal growth requirements) as evidenced by  (29 week IUP).  GOAL:   Patient will meet greater than or equal to 90% of their needs  MONITOR:   Weight trends  REASON FOR ASSESSMENT:  Antenatal, LOS   ASSESSMENT:   29 6/7 weeks, adm with PROM, PTL, cerclage. pre-preg weight 255 lbs, BMI 40.1     8 lb weight gain to date  Diet Order:   Diet Order            Diet regular Room service appropriate? Yes; Fluid consistency: Thin  Diet effective now                EDUCATION NEEDS:   No education needs have been identified at this time  Skin:  Skin Assessment: Reviewed RN Assessment  Height:   Ht Readings from Last 1 Encounters:  04/28/20 5\' 7"  (1.702 m)   Weight:   Wt Readings from Last 1 Encounters:  04/28/20 119.7 kg   Ideal Body Weight:   135 lbs  BMI:  Body mass index is 41.35 kg/m.  Estimated Nutritional Needs:   Kcal:  1900 -  2100  Protein:  85 - 95 g  Fluid:  2.2 L    2101 M.M LDN Neonatal Nutrition Support Specialist/RD III

## 2020-05-01 LAB — CBC
HCT: 34.4 % — ABNORMAL LOW (ref 36.0–46.0)
Hemoglobin: 11 g/dL — ABNORMAL LOW (ref 12.0–15.0)
MCH: 27.6 pg (ref 26.0–34.0)
MCHC: 32 g/dL (ref 30.0–36.0)
MCV: 86.4 fL (ref 80.0–100.0)
Platelets: 208 10*3/uL (ref 150–400)
RBC: 3.98 MIL/uL (ref 3.87–5.11)
RDW: 14.4 % (ref 11.5–15.5)
WBC: 9.3 10*3/uL (ref 4.0–10.5)
nRBC: 0 % (ref 0.0–0.2)

## 2020-05-01 LAB — COMPREHENSIVE METABOLIC PANEL
ALT: 21 U/L (ref 0–44)
AST: 17 U/L (ref 15–41)
Albumin: 2.7 g/dL — ABNORMAL LOW (ref 3.5–5.0)
Alkaline Phosphatase: 64 U/L (ref 38–126)
Anion gap: 8 (ref 5–15)
BUN: <5 mg/dL — ABNORMAL LOW (ref 6–20)
CO2: 21 mmol/L — ABNORMAL LOW (ref 22–32)
Calcium: 8.6 mg/dL — ABNORMAL LOW (ref 8.9–10.3)
Chloride: 108 mmol/L (ref 98–111)
Creatinine, Ser: 0.78 mg/dL (ref 0.44–1.00)
GFR, Estimated: >60 mL/min (ref >60)
Glucose, Bld: 83 mg/dL (ref 70–99)
Potassium: 3.6 mmol/L (ref 3.5–5.1)
Sodium: 137 mmol/L (ref 135–145)
Total Bilirubin: 0.4 mg/dL (ref 0.3–1.2)
Total Protein: 6.1 g/dL — ABNORMAL LOW (ref 6.5–8.1)

## 2020-05-01 MED ORDER — NIFEDIPINE ER OSMOTIC RELEASE 30 MG PO TB24
30.0000 mg | ORAL_TABLET | Freq: Two times a day (BID) | ORAL | Status: DC
  Administered 2020-05-01 – 2020-05-04 (×6): 30 mg via ORAL
  Filled 2020-05-01 (×6): qty 1

## 2020-05-01 NOTE — Progress Notes (Signed)
Patient ID: Natalie Dunn, female   DOB: [birth year], 29 y.o.   MRN: [id number] Patient rested comfortably overnight. Only intermittent vaginal pressure that she has been feeling since admission, denies strong cramping, bleeding. +FM, still stable leakage of fluid. Denies PreE symptoms. Feels better after sitting shower this morning   BP (!) 140/92   Pulse 87   Temp 98.9 F (37.2 C) (Oral)   Resp 16   Ht 5\' 7"  (1.702 m)   Wt 119.7 kg   SpO2 99%   BMI 41.35 kg/m   BP range overnight 138-142/84-92 Abd gravid, NTTP  Cervix exam deferred  30 0/7 weeks PPROM/cerclage removal/?gestational hypertension   Moved to L&D on 10/19 w/ concerns fo progressing labor, received MgSO4 x12hrs, cerclage cut, Ce last 3/90/0 on 10/21. However, no progression since yesterday afternoon, patient comfortable this morning. S/p BMTZ (both initial and rescue). Will move back down to [facility] specialty care at this time given no progression of labor  Possible GHTN: Elevated BP requiring IV labetalol x2 last at 1600 on 10/21. PreE labs drawn yesterday WNL, repeat CBC/CMP today stable, pt asx. Question of lability in pressure esp with stress added. Procardia 30 BID added to orders last night, only mild elevations noted since, will continue to monitor.  Transfer to [facility] specialty care at this time. NSt reactive this AM after shower

## 2020-05-02 MED ORDER — LACTATED RINGERS IV BOLUS
1000.0000 mL | Freq: Once | INTRAVENOUS | Status: AC
  Administered 2020-05-02: 1000 mL via INTRAVENOUS

## 2020-05-02 NOTE — Progress Notes (Signed)
VE by Dr. Reina Fuse. Patient remain 3/90/0. Pt complaining of vaginal pressure. 1 Liter bolus in progress. Pt placed on continuous monitoring. Carmelina Dane, RN

## 2020-05-02 NOTE — Progress Notes (Signed)
Continuous monitoring category 1 overall, TOCO without activity. Change to NST q shift at this time

## 2020-05-02 NOTE — Progress Notes (Signed)
Patient refusing Type and Screen draw for today. Pt educated. Carmelina Dane, RN

## 2020-05-02 NOTE — Progress Notes (Signed)
Patient ID: Natalie Dunn, female   DOB: Jul 02, 1991, 29 y.o.   MRN: [id number] Patient rested comfortably overnight. Concerned about increased vaginal pressure overnight, denies strong cramping, bleeding. +FM, still stable leakage of fluid. Denies PreE symptoms.   BP (!) 150/95 (BP Location: Left Arm)   Pulse 87   Temp 98 F (36.7 C) (Oral)   Resp 18   Ht 5\' 7"  (1.702 m)   Wt 119.7 kg   SpO2 98%   BMI 41.35 kg/m   BP range overnight 142-154/84-92 Abd gravid, NTTP  CE 3/90/0, compound presentation with RUE over occiput  30 1/7 weeks PPROM/cerclage removal/?gestational hypertension   Moved to L&D on 10/19 w/ concerns fo progressing labor, received MgSO4 x12hrs, cerclage cut, CE last 3/90/0 on 10/21. S/p BMTZ (both initial and rescue). Moved back to L&D on 10/22 after no progression in labor. Completed latency abx.   Possible GHTN: Elevated BP requiring IV labetalol x2 last at 1600 on 10/21. PreE labs drawn 10/22 and 10/23 WNL. Question of lability in pressure esp with stress added. BP only moderately elevated now on Procardia 30 BID  Ce unchanged this AM. However, given increased pt pressure, will place on continuous monitoring at this time and order fluid bolus. Close eye on status

## 2020-05-03 NOTE — Progress Notes (Signed)
Patient ID: Natalie Dunn, female   DOB: 17-Apr-1991, 29 y.o.   MRN: [id number] Patient rested comfortably overnight. Vaginal pressure unchanged from yesterday, denies strong cramping, bleeding. +FM, still stable leakage of fluid. Denies PreE symptoms.   BP (!) 141/94 (BP Location: Left Arm)   Pulse 99   Temp 98.6 F (37 C) (Oral)   Resp 18   Ht 5\' 7"  (1.702 m)   Wt 119.7 kg   SpO2 97%   BMI 41.35 kg/m   BP range 136-149/82-92 Abd gravid, NTTP NST reactive, no activity on TOCO  30 2/7 weeks PPROM/cerclage removal/?gestational hypertension   Moved to L&D on 10/19 w/ concerns fo progressing labor, received MgSO4 x12hrs, cerclage cut, CE last 3/90/0 on 10/21. S/p BMTZ (both initial and rescue). Moved back to L&D on 10/22 after no progression in labor. Completed latency abx.   Likely GHTN: Elevated BP requiring IV labetalol x2 last at 1600 on 10/21. PreE labs drawn 10/22 and 10/23 WNL. Question of lability in pressure esp with stress added. BP only moderately elevated now on Procardia 30 BID. Pt continues to be asymptomatic, continue to monitor  No complaints this AM. Type and screen to be drawn tomorrow (pt declined today). Per MFM orders, pt to have weekly BPP and AFI, not done this past week as she was moved to L&D in anticipation of delivery. Will place order for tomorrow morning.

## 2020-05-03 NOTE — Progress Notes (Signed)
Pt declines type and screen for today. Pt aware of importance of up to date T&S. Pt agreeable to have it done in AM. Order changed to tomorrow morning.

## 2020-05-04 ENCOUNTER — Inpatient Hospital Stay (HOSPITAL_BASED_OUTPATIENT_CLINIC_OR_DEPARTMENT_OTHER): Payer: Medicaid Other

## 2020-05-04 DIAGNOSIS — O3433 Maternal care for cervical incompetence, third trimester: Secondary | ICD-10-CM

## 2020-05-04 DIAGNOSIS — O09213 Supervision of pregnancy with history of pre-term labor, third trimester: Secondary | ICD-10-CM | POA: Diagnosis not present

## 2020-05-04 DIAGNOSIS — E669 Obesity, unspecified: Secondary | ICD-10-CM

## 2020-05-04 DIAGNOSIS — O99213 Obesity complicating pregnancy, third trimester: Secondary | ICD-10-CM

## 2020-05-04 DIAGNOSIS — Z363 Encounter for antenatal screening for malformations: Secondary | ICD-10-CM

## 2020-05-04 DIAGNOSIS — Z3A30 30 weeks gestation of pregnancy: Secondary | ICD-10-CM

## 2020-05-04 DIAGNOSIS — O42913 Preterm premature rupture of membranes, unspecified as to length of time between rupture and onset of labor, third trimester: Secondary | ICD-10-CM | POA: Diagnosis not present

## 2020-05-04 LAB — TYPE AND SCREEN
ABO/RH(D): O POS
Antibody Screen: NEGATIVE

## 2020-05-04 MED ORDER — NIFEDIPINE ER OSMOTIC RELEASE 30 MG PO TB24
60.0000 mg | ORAL_TABLET | Freq: Every day | ORAL | Status: DC
  Administered 2020-05-04 – 2020-05-06 (×3): 60 mg via ORAL
  Filled 2020-05-04 (×3): qty 2

## 2020-05-04 NOTE — Progress Notes (Signed)
HD #19, [redacted]W[redacted]D, PPROM, GHTN, cerclage removed Doing well, few ctx, +FM Afeb, VSS, BP 130-140/80-90 FHT- reactive NSTs Fundus NT  Doing well, will continue expectant management.  Procardia changed from 30 mg bid to 60 mg daily.  Getting u/s today

## 2020-05-05 ENCOUNTER — Encounter (HOSPITAL_COMMUNITY): Payer: Self-pay | Admitting: Obstetrics and Gynecology

## 2020-05-05 LAB — HEPATIC FUNCTION PANEL
ALT: 24 U/L (ref 0–44)
AST: 20 U/L (ref 15–41)
Albumin: 2.6 g/dL — ABNORMAL LOW (ref 3.5–5.0)
Alkaline Phosphatase: 70 U/L (ref 38–126)
Bilirubin, Direct: <0.1 mg/dL (ref 0.0–0.2)
Total Bilirubin: 0.3 mg/dL (ref 0.3–1.2)
Total Protein: 6.2 g/dL — ABNORMAL LOW (ref 6.5–8.1)

## 2020-05-05 LAB — CBC
HCT: 35 % — ABNORMAL LOW (ref 36.0–46.0)
Hemoglobin: 11.5 g/dL — ABNORMAL LOW (ref 12.0–15.0)
MCH: 28.3 pg (ref 26.0–34.0)
MCHC: 32.9 g/dL (ref 30.0–36.0)
MCV: 86.2 fL (ref 80.0–100.0)
Platelets: 205 10*3/uL (ref 150–400)
RBC: 4.06 MIL/uL (ref 3.87–5.11)
RDW: 13.9 % (ref 11.5–15.5)
WBC: 8.7 10*3/uL (ref 4.0–10.5)
nRBC: 0 % (ref 0.0–0.2)

## 2020-05-05 LAB — PROTEIN / CREATININE RATIO, URINE
Creatinine, Urine: 97.48 mg/dL
Protein Creatinine Ratio: 0.31 mg/mg{Cre} — ABNORMAL HIGH (ref 0.00–0.15)
Total Protein, Urine: 30 mg/dL

## 2020-05-05 MED ORDER — IBUPROFEN 600 MG PO TABS
600.0000 mg | ORAL_TABLET | Freq: Four times a day (QID) | ORAL | Status: DC
  Administered 2020-05-05 – 2020-05-07 (×8): 600 mg via ORAL
  Filled 2020-05-05 (×8): qty 1

## 2020-05-05 MED ORDER — FLEET ENEMA 7-19 GM/118ML RE ENEM: 1.0000 | ENEMA | RECTAL | Status: DC | PRN

## 2020-05-05 MED ORDER — OXYTOCIN-SODIUM CHLORIDE 30-0.9 UT/500ML-% IV SOLN: 2.5000 [IU]/h | INTRAVENOUS | Status: DC

## 2020-05-05 MED ORDER — OXYCODONE-ACETAMINOPHEN 5-325 MG PO TABS: 2.0000 | ORAL_TABLET | ORAL | Status: DC | PRN

## 2020-05-05 MED ORDER — COCONUT OIL OIL: 1.0000 "application " | TOPICAL_OIL | Status: DC | PRN

## 2020-05-05 MED ORDER — OXYCODONE-ACETAMINOPHEN 5-325 MG PO TABS: 1.0000 | ORAL_TABLET | ORAL | Status: DC | PRN

## 2020-05-05 MED ORDER — DIPHENHYDRAMINE HCL 25 MG PO CAPS: 25.0000 mg | ORAL_CAPSULE | Freq: Four times a day (QID) | ORAL | Status: DC | PRN

## 2020-05-05 MED ORDER — ONDANSETRON HCL 4 MG/2ML IJ SOLN: 4.0000 mg | Freq: Four times a day (QID) | INTRAMUSCULAR | Status: DC | PRN

## 2020-05-05 MED ORDER — ONDANSETRON HCL 4 MG/2ML IJ SOLN: 4.0000 mg | INTRAMUSCULAR | Status: DC | PRN

## 2020-05-05 MED ORDER — SENNOSIDES-DOCUSATE SODIUM 8.6-50 MG PO TABS
2.0000 | ORAL_TABLET | ORAL | Status: DC
  Administered 2020-05-05 – 2020-05-06 (×2): 2 via ORAL
  Filled 2020-05-05 (×2): qty 2

## 2020-05-05 MED ORDER — ZOLPIDEM TARTRATE 5 MG PO TABS: 5.0000 mg | ORAL_TABLET | Freq: Every evening | ORAL | Status: DC | PRN

## 2020-05-05 MED ORDER — LABETALOL HCL 5 MG/ML IV SOLN: 40.0000 mg | INTRAVENOUS | Status: DC | PRN

## 2020-05-05 MED ORDER — OXYTOCIN BOLUS FROM INFUSION
333.0000 mL | Freq: Once | INTRAVENOUS | Status: AC
  Administered 2020-05-05: 333 mL via INTRAVENOUS

## 2020-05-05 MED ORDER — ACETAMINOPHEN 325 MG PO TABS: 650.0000 mg | ORAL_TABLET | ORAL | Status: DC | PRN

## 2020-05-05 MED ORDER — HYDRALAZINE HCL 20 MG/ML IJ SOLN: 10.0000 mg | INTRAMUSCULAR | Status: DC | PRN

## 2020-05-05 MED ORDER — TETANUS-DIPHTH-ACELL PERTUSSIS 5-2.5-18.5 LF-MCG/0.5 IM SUSY: 0.5000 mL | PREFILLED_SYRINGE | Freq: Once | INTRAMUSCULAR | Status: DC

## 2020-05-05 MED ORDER — LABETALOL HCL 5 MG/ML IV SOLN: 20.0000 mg | INTRAVENOUS | Status: DC | PRN

## 2020-05-05 MED ORDER — ONDANSETRON HCL 4 MG PO TABS: 4.0000 mg | ORAL_TABLET | ORAL | Status: DC | PRN

## 2020-05-05 MED ORDER — FENTANYL CITRATE (PF) 100 MCG/2ML IJ SOLN: 100.0000 ug | Freq: Once | INTRAMUSCULAR | Status: DC

## 2020-05-05 MED ORDER — OXYTOCIN-SODIUM CHLORIDE 30-0.9 UT/500ML-% IV SOLN
INTRAVENOUS | Status: AC
  Filled 2020-05-05: qty 500

## 2020-05-05 MED ORDER — BENZOCAINE-MENTHOL 20-0.5 % EX AERO: 1.0000 "application " | INHALATION_SPRAY | CUTANEOUS | Status: DC | PRN

## 2020-05-05 MED ORDER — LABETALOL HCL 5 MG/ML IV SOLN: 80.0000 mg | INTRAVENOUS | Status: DC | PRN

## 2020-05-05 MED ORDER — LIDOCAINE HCL (PF) 1 % IJ SOLN: 30.0000 mL | INTRAMUSCULAR | Status: DC | PRN

## 2020-05-05 MED ORDER — WITCH HAZEL-GLYCERIN EX PADS: 1.0000 "application " | MEDICATED_PAD | CUTANEOUS | Status: DC | PRN

## 2020-05-05 MED ORDER — LACTATED RINGERS IV SOLN: 500.0000 mL | INTRAVENOUS | Status: DC | PRN

## 2020-05-05 MED ORDER — OXYCODONE HCL 5 MG PO TABS: 10.0000 mg | ORAL_TABLET | ORAL | Status: DC | PRN

## 2020-05-05 MED ORDER — FENTANYL CITRATE (PF) 100 MCG/2ML IJ SOLN
INTRAMUSCULAR | Status: AC
  Filled 2020-05-05: qty 2

## 2020-05-05 MED ORDER — PRENATAL MULTIVITAMIN CH
1.0000 | ORAL_TABLET | Freq: Every day | ORAL | Status: DC
  Administered 2020-05-06 – 2020-05-07 (×2): 1 via ORAL
  Filled 2020-05-05 (×2): qty 1

## 2020-05-05 MED ORDER — SOD CITRATE-CITRIC ACID 500-334 MG/5ML PO SOLN: 30.0000 mL | ORAL | Status: DC | PRN

## 2020-05-05 MED ORDER — OXYCODONE HCL 5 MG PO TABS: 5.0000 mg | ORAL_TABLET | ORAL | Status: DC | PRN

## 2020-05-05 MED ORDER — DIBUCAINE (PERIANAL) 1 % EX OINT: 1.0000 "application " | TOPICAL_OINTMENT | CUTANEOUS | Status: DC | PRN

## 2020-05-05 MED ORDER — SIMETHICONE 80 MG PO CHEW: 80.0000 mg | CHEWABLE_TABLET | ORAL | Status: DC | PRN

## 2020-05-05 NOTE — Progress Notes (Addendum)
Patient ID: Natalie Dunn, female   DOB: September 17, 1990, 29 y.o.   MRN: [id number] I was notified ( while in a delivery) that pt was complaining of increased pelvic pressure and contractions rated at 6/10. I performed an sve on arrival to her room and noted pt was completely dilated, clear fluid noted. On last check on 04/30/20 pt was 3cm dilated  I recommended pt be transferred to L/D immediately for imminent delivery  I discussed with pt that given imminent delivery no other interventions will be employed at this time  Anticipate immenent delivery  BP elevated : 154/96 - uncertain if due to pain. Will obtain preE labs and treat if indicated  GBS neg

## 2020-05-05 NOTE — Lactation Note (Signed)
This note was copied from a baby's chart. Lactation Consultation Note  Patient Name: Natalie Dunn JMEQA'S Date: 05/05/2020 Reason for consult: Initial assessment;Mother's request;Preterm <34wks;Infant < 6lbs;Other (Comment) (Gestational Hypertension on Nifedipine)  Infant is 30 weeks 4 hours old in the NICU. Mom received Mag Sulfate and other blood pressure medications. She is currently on a daily dose of Nifedipine.   LC examined Mom's breast and noted some dependent edema under both right and left breast. We used heat and breast massage to stimulate the breast along with hand expression. After applying the heat, some decrease in swelling was noted in both areas.   LC alerted RN, Corrine, that Mom had some dependent edema under both breasts to monitor.  LC set Mom up on DEBP increasing the flange size from 24 to 27. Mom's nipples are flat and she states more than usual. LC informed Mom the pumping will help reduce swelling. LC also gave Mom ice to apply to the areas for 20 minutes 2-3 x a day.   LC set up the DEBP and reviewed parts, assembly, cleaning and milk storage with parents.   Plan 1. To pump q 3 hours with DEBP for 15 minutes. Mom to stimulate the breast with heat, breast massage and hand expression. She will alert the RN when she is able to collect a sufficient amount to take to the NICU.             2. Mom to apply ice to areas with swelling 2-3 x a day for 20 minutes.             3. Mom to alert the RN if the swelling does not decrease with current treatment.

## 2020-05-06 LAB — CBC
HCT: 34.9 % — ABNORMAL LOW (ref 36.0–46.0)
Hemoglobin: 11.4 g/dL — ABNORMAL LOW (ref 12.0–15.0)
MCH: 28 pg (ref 26.0–34.0)
MCHC: 32.7 g/dL (ref 30.0–36.0)
MCV: 85.7 fL (ref 80.0–100.0)
Platelets: 193 10*3/uL (ref 150–400)
RBC: 4.07 MIL/uL (ref 3.87–5.11)
RDW: 14 % (ref 11.5–15.5)
WBC: 7.2 10*3/uL (ref 4.0–10.5)
nRBC: 0 % (ref 0.0–0.2)

## 2020-05-06 MED ORDER — NIFEDIPINE ER OSMOTIC RELEASE 30 MG PO TB24
30.0000 mg | ORAL_TABLET | Freq: Once | ORAL | Status: AC
  Administered 2020-05-06: 30 mg via ORAL
  Filled 2020-05-06: qty 1

## 2020-05-06 MED ORDER — NIFEDIPINE ER OSMOTIC RELEASE 30 MG PO TB24: 120.0000 mg | ORAL_TABLET | Freq: Every day | ORAL | Status: DC

## 2020-05-06 MED ORDER — NIFEDIPINE ER OSMOTIC RELEASE 30 MG PO TB24
30.0000 mg | ORAL_TABLET | Freq: Once | ORAL | Status: DC
  Filled 2020-05-06: qty 1

## 2020-05-06 MED ORDER — NIFEDIPINE ER OSMOTIC RELEASE 30 MG PO TB24: 90.0000 mg | ORAL_TABLET | Freq: Every day | ORAL | Status: DC

## 2020-05-06 MED ORDER — NIFEDIPINE ER OSMOTIC RELEASE 30 MG PO TB24
60.0000 mg | ORAL_TABLET | Freq: Two times a day (BID) | ORAL | Status: DC
  Administered 2020-05-06 – 2020-05-07 (×2): 60 mg via ORAL
  Filled 2020-05-06 (×2): qty 2

## 2020-05-06 NOTE — Progress Notes (Signed)
Post Partum Day 1 (mother seen in NICU w baby) Subjective: no complaints, up ad lib, voiding, tolerating PO and nl lochia, pain controlled  Objective: Blood pressure (!) 139/91, pulse 80, temperature 98.1 F (36.7 C), temperature source Tympanic, resp. rate 17, height 5\' 7"  (1.702 m), weight 119.7 kg, SpO2 99 %, unknown if currently breastfeeding.  Physical Exam:  General: alert and no distress Lochia: appropriate Uterine Fundus: firm   Recent Labs    05/05/20 1558  HGB 11.5*  HCT 35.0*    Assessment/Plan: Breastfeeding, Lactation consult.  SVD after PPROM, baby in NICU.   Watch BP on Procardia 60, had extra dose on 30mg  early this am.   LOS: 15 days   Damilola Flamm Bovard-Stuckert 05/06/2020, 7:27 AM

## 2020-05-06 NOTE — Lactation Note (Signed)
This note was copied from a baby's chart. Lactation Consultation Note  Patient Name: Natalie Dunn JASNK'N Date: 05/06/2020 Reason for consult: Follow-up assessment;NICU baby;Preterm <34wks  LC to room for f/u visit. Mom is pumping q 3 hours to stimulate breasts. She is aware of how to hand express and has colostrum collection containers in room. Pt has a medela DEBP at home and is unaware if she will qualify for [facility]. LC encouraged her to consider WIC participation or hospital-grade electric pump use in the early days following her d/c. Pt was using heat packs on breasts during visit. LC encouraged ice for swelling and limiting heat to a few minutes directly prior to pumping. Pt was offered the opportunity to ask questions and all concerns were addressed. LC to plan f/u care prn.  Consult Status Consult Status: Follow-up Date: 05/07/20 Follow-up type: In-patient    Elder Negus 05/06/2020, 11:05 AM

## 2020-05-06 NOTE — Progress Notes (Signed)
Patient ID: Natalie Dunn, female   DOB: 1990-10-21, 29 y.o.   MRN: [id number]  Pt with questions about BP meds and need for treatment D/W pt trying to keep BP at normal range - prevent stroke, seizure, other medical issues, can be related to pregnancy and delivery.  Pt agrees to 60mg  Procardia XL twice daily.  Anxious for discharge.  D/W pt will assess in AM.

## 2020-05-06 NOTE — Progress Notes (Signed)
Patient ID: Natalie Dunn, female   DOB: 1991-04-20, 29 y.o.   MRN: [id number]  BP review  Despite Procardia XL 30mg  at 3am, Pt's diastolic BP is high 90's, will give extra 30mg  XL now and increase daily dose to tomorrow.

## 2020-05-06 NOTE — Progress Notes (Signed)
Patient ID: Natalie Dunn, female   DOB: 1991/02/09, 29 y.o.   MRN: [id number] Chart check - pt BP have stayed elevated since delivery ; only one reading in normal range.  Pt resting. No complaints. Pain controlled with meds As per discussion immediately postpartum with pt, I ordered 30mg  procardia to be given now.   Labs ordered post delivery - 8.6>10.9<211, ALT - 21; AST 20   Pt is currently scheduled for a 60mg  dose of procardia at 10am Will monitor BPs and adjust medication as needed

## 2020-05-06 NOTE — Progress Notes (Signed)
Pt declined procardia at this time.  Is concerned about the amount and frequency of medication given while in the hospital and states "that is just not me", and does not believe that high blood pressure is a concern at this time.  Pt educated on normal course of Preeclampsia starting before delivery, how it can advance after delivery, and possibility that several different medications may need to be used to treat BP before desired control is achieved.  Risk of complications discussed, emphasizing seizure and long term neurological damage.  Pt verbalizes understanding.    Taking increased Procardia dosage at this time recommended and strongly encouraged.  Pt again declined medication and would like to discuss further management with MD.   Risks of declining medication once again addressed, pt continues to verbalize understanding.    MD notified.

## 2020-05-07 MED ORDER — LABETALOL HCL 100 MG PO TABS
100.0000 mg | ORAL_TABLET | Freq: Two times a day (BID) | ORAL | 1 refills | Status: DC
Start: 2020-05-07 — End: 2021-05-08

## 2020-05-07 MED ORDER — LABETALOL HCL 100 MG PO TABS
100.0000 mg | ORAL_TABLET | Freq: Two times a day (BID) | ORAL | Status: DC
  Administered 2020-05-07: 100 mg via ORAL
  Filled 2020-05-07: qty 1

## 2020-05-07 MED ORDER — ACETAMINOPHEN 325 MG PO TABS: 650.0000 mg | ORAL_TABLET | ORAL | 0 refills | Status: AC | PRN

## 2020-05-07 MED ORDER — NIFEDIPINE ER 60 MG PO TB24
60.0000 mg | ORAL_TABLET | Freq: Two times a day (BID) | ORAL | 1 refills | Status: DC
Start: 2020-05-07 — End: 2021-05-08

## 2020-05-07 MED ORDER — IBUPROFEN 600 MG PO TABS
600.0000 mg | ORAL_TABLET | Freq: Four times a day (QID) | ORAL | 0 refills | Status: DC
Start: 2020-05-08 — End: 2021-05-08

## 2020-05-07 NOTE — Progress Notes (Signed)
Patient ID: Natalie Dunn, female   DOB: 1991/02/10, 30 y.o.   MRN: [id number] Pt feeling well.  No HA or PIH sx.  BP acceptable today on procardia 60mg  po BID and labetalol 100mg  po BID.  She desires to go home and is agreeable to returning to office in 3-4 days and monitoring her BP at home with her cuff.  Will d/c home and she will call in BP over the weekend  Baby stable in NICU

## 2020-05-07 NOTE — Discharge Summary (Signed)
Postpartum Discharge Summary      Patient Name: Natalie Dunn DOB: [birth year] MRN: [id number]  Date of admission: 04/20/2020 Delivery date:05/05/2020  Delivering provider: Sherlyn Hay  Date of discharge: 05/07/2020  Admitting diagnosis: Preterm premature rupture of membranes (PPROM) with unknown onset of labor [O42.919] Intrauterine pregnancy: [redacted]w[redacted]d    Secondary diagnosis:  Active Problems:   Preterm premature rupture of membranes (PPROM) with unknown onset of labor  Additional problems: Gestational hypertension    Discharge diagnosis: Preterm Pregnancy Delivered, Gestational Hypertension and PPROM                                              Post partum procedures:antihypertensive  Complications: None  Hospital course: Onset of Labor With Vaginal Delivery      29y.o. yo [id number] admitted with PROM 04/20/2020. She was admitted and had a long latency period from 04/20/20 until 05/05/20 when she went into labor and progressed rapidly to complete dilation.  Her cerclage was removed 04/30/20 when she was having contractions and found to be 2-3 cm dilated.  THe contractions ultimately stopped and she did not resume labor until 10.26.21.  She had a course of steroids and also magnesium for neuroprotection x 2 when she had episodes of contractions.  She was GBS negative.  She developed elevated blood pressures c/w gestational hypertension but had no symptoms or lab abnormalities c/w preeclampsia.  Membrane Rupture Time/Date: 10:00 PM ,04/20/2020   Delivery Method:Vaginal, Spontaneous  Episiotomy: None  Lacerations:  None  Patient had a postpartum course significant for elevated blood pressures.  THese were controlled on Procardia XL 6064mo BID and labetalol 100m36m BID.  She is ambulating, tolerating a regular diet, passing flatus, and urinating well. She had no HA or PIH sx at any time.  Patient is discharged home in stable condition on 05/07/20.  Newborn  Data: Birth date:05/05/2020  Birth time:11:59 AM  Gender:Female  Living status:Living  Apgars:6 ,7  Weight:1520 g   Magnesium Sulfate received: Yes: Neuroprotection BMZ received: Yes Rhophylac:No MMR:No   Physical exam  Vitals:   05/07/20 1151 05/07/20 1627 05/07/20 1722 05/07/20 1837  BP: 134/80 (!) 143/89 133/84 139/89  Pulse: 84 95 98 94  Resp: 18 18    Temp: 98.4 F (36.9 C) 98.2 F (36.8 C)    TempSrc: Oral Oral    SpO2: 98% 99%    Weight:      Height:       General: alert and cooperative Lochia: appropriate Uterine Fundus: firm  Labs: Lab Results  Component Value Date   WBC 7.2 05/06/2020   HGB 11.4 (L) 05/06/2020   HCT 34.9 (L) 05/06/2020   MCV 85.7 05/06/2020   PLT 193 05/06/2020   CMP Latest Ref Rng & Units 05/05/2020  Glucose 70 - 99 mg/dL -  BUN 6 - 20 mg/dL -  Creatinine 0.44 - 1.00 mg/dL -  Sodium 135 - 145 mmol/L -  Potassium 3.5 - 5.1 mmol/L -  Chloride 98 - 111 mmol/L -  CO2 22 - 32 mmol/L -  Calcium 8.9 - 10.3 mg/dL -  Total Protein 6.5 - 8.1 g/dL 6.2(L)  Total Bilirubin 0.3 - 1.2 mg/dL 0.3  Alkaline Phos 38 - 126 U/L 70  AST 15 - 41 U/L 20  ALT 0 - 44 U/L 24  Edinburgh Score: Edinburgh Postnatal Depression Scale Screening Tool 05/06/2020  I have been able to laugh and see the funny side of things. 0  I have looked forward with enjoyment to things. 0  I have blamed myself unnecessarily when things went wrong. 0  I have been anxious or worried for no good reason. 0  I have felt scared or panicky for no good reason. 0  Things have been getting on top of me. 0  I have been so unhappy that I have had difficulty sleeping. 0  I have felt sad or miserable. 0  I have been so unhappy that I have been crying. 0  The thought of harming myself has occurred to me. 0  Edinburgh Postnatal Depression Scale Total 0     After visit meds:  Allergies as of 05/07/2020   No Known Allergies     Medication List    STOP taking these medications    progesterone 200 MG capsule Commonly known as: PROMETRIUM     TAKE these medications   acetaminophen 325 MG tablet Commonly known as: Tylenol Take 2 tablets (650 mg total) by mouth every 4 (four) hours as needed (for pain scale < 4).   ibuprofen 600 MG tablet Commonly known as: ADVIL Take 1 tablet (600 mg total) by mouth every 6 (six) hours. Start taking on: May 08, 2020   labetalol 100 MG tablet Commonly known as: NORMODYNE Take 1 tablet (100 mg total) by mouth 2 (two) times daily.   NIFEdipine 60 MG 24 hr tablet Commonly known as: ADALAT CC Take 1 tablet (60 mg total) by mouth 2 (two) times daily.   PRENATAL PO Take 1 tablet by mouth at bedtime.        Discharge home in stable condition Infant Feeding: Breast Infant Disposition:NICU Discharge instruction: per After Visit Summary and Postpartum booklet. Activity: Advance as tolerated. Pelvic rest for 6 weeks.  Diet: routine diet Future Appointments:No future appointments. Follow up Visit:  Follow-up Information    Paula Compton, MD. Schedule an appointment as soon as possible for a visit in 4 day(s).   Specialty: Obstetrics and Gynecology Why: Blood pressure check Contact information: [address] [place] [address] [phone]                Please schedule this patient for a In person postpartum visit in 2-3 days with the following provider: MD. Additional Postpartum F/U:BP check 2-3 days   Delivery mode:  Vaginal, Spontaneous  Anticipated Birth Control:  Unsure   05/07/2020 Logan Bores, MD

## 2020-05-07 NOTE — Lactation Note (Signed)
This note was copied from a baby's chart. Lactation Consultation Note  Patient Name: Natalie Dunn OEVOJ'J Date: 05/07/2020 Reason for consult: Follow-up assessment;NICU baby;Preterm <34wks LC to room for f/u visit. Mom is pumping q 3 hours and using HE to remove colostrum for oral care. Reviewed milk volume timeline. Provided additional colostrum collection containers and Medela sanitizing spray. Mom heading to NICU to see baby. Offered opportunity to ask questions and all concerns were addressed during visit. Will plan f/u visit.   Maternal Data Has patient been taught Hand Expression?: Yes   Interventions Interventions: Breast feeding basics reviewed;Hand express;DEBP  Lactation Tools Discussed/Used Tools: Other (comment) (colostrum collection container)   Consult Status Consult Status: Follow-up Date: 05/08/20 Follow-up type: In-patient    Natalie Dunn 05/07/2020, 10:27 AM

## 2020-05-07 NOTE — Progress Notes (Signed)
Patient given discharge instructions including medications, follow up appointments, and signs/symptoms of postpartum preeclampsia. Handout on Pre-e given to patient as well.   Teach-back method used and patient demonstrated understanding.   Quincy Simmonds, RN

## 2020-05-07 NOTE — Clinical Social Work Maternal (Signed)
CLINICAL SOCIAL WORK MATERNAL/CHILD NOTE  Patient Details  Name: Natalie Dunn MRN: [id number] Date of Birth: 25-Dec-1990  Date:  05/07/2020  Clinical Social Worker Initiating Note:  Laurey Arrow Date/Time: Initiated:  05/06/20/1024     Child's Name:      Biological Parents:  Mother, Father   Need for Interpreter:  None   Reason for Referral:  Parental Support of Premature Babies < 32 weeks/or Critically Ill babies   Address:  [place] [address]    Phone number:  [phone] (home)     Additional phone number: FOB's number is [phone]  Household Members/Support Persons (HM/SP):   Household Member/Support Person 1, Household Member/Support Person 2, Household Member/Support Person 3   HM/SP Name Relationship DOB or Age  HM/SP -Fuig FOB 12/15/1981  HM/SP -2 Dephaniah Nori Riis son 11/14/2012  HM/SP -3 Saliah Crisp son 06/26/2016  HM/SP -4        HM/SP -5        HM/SP -6        HM/SP -7        HM/SP -8          Natural Supports (not living in the home):  Immediate Family, Extended Family, Friends   Chiropodist: None   Employment: Unemployed   Type of Work:     Education:  Public librarian arranged:    Museum/gallery curator Resources:  Medicaid   Other Resources:      Cultural/Religious Considerations Which May Impact Care:  None reported  Strengths:  Ability to meet basic needs , Engineer, materials, Home prepared for child    Psychotropic Medications:         Pediatrician:    [employer] area  Pediatrician List:   [facility]  [place]      Pediatrician Fax Number:    Risk Factors/Current Problems:  None   Cognitive State:  Alert , Insightful , Goal Oriented , Linear Thinking    Mood/Affect:  Happy , Bright , Calm , Interested , Comfortable , Relaxed    CSW Assessment: CSW met  with MOB in room 115 to complete and assessment for NICU admission. When CSW arrived, MOB was resting in bed. CSW explained CSW's role and MOB was receptive to meeting. MOB was polite and easy to engaging.  CSW asked MOB to share her story of labor and delivery as well as baby's admission to NICU and how she felt emotionally throughout her experience.  MOB was open to talking with CSW and sharing her feelings.  MOB openly shared that this is her 3rd NICU experience the She states baby's admission to NICU. Per MOB, she was expecting to have her baby go to the NICU due to her previous preterm labor experience.  MOB recognize that every admission to the NICU is different however she is hopeful that infant will progress well.  CSW assisted her in identifying strengths, which she was able to.    MOB reported having a good support team and all essential items to care for infant during the postpartum period.   NICU visitation was explained MOB denied having any questions or concerns. MOB also denied barrier to future visits and psychosocial stressors.   CSW will continue to offer resources and supports to family while infant remains in NICU.     CSW Plan/Description:  Psychosocial Support and Ongoing Assessment of Needs, Sudden Infant Death Syndrome (SIDS) Education, Perinatal Mood and Anxiety Disorder (PMADs) Education, Other Patient/Family Education, Other Information/Referral to [employer], MSW, [employer] Social Work [phone]  Dimple Nanas, LCSW 05/07/2020, 10:28 AM

## 2020-05-07 NOTE — Progress Notes (Signed)
Post Partum Day 2 Subjective: no complaints, up ad lib and tolerating PO  No HA or PIH sx.  Pumping, bleeding normal  Objective: Blood pressure (!) 139/96, pulse (!) 110, temperature 97.8 F (36.6 C), temperature source Oral, resp. rate 18, height 5\' 7"  (1.702 m), weight 119.7 kg, SpO2 99 %, unknown if currently breastfeeding.  Physical Exam:  General: alert and cooperative Lochia: appropriate Uterine Fundus: firm   Recent Labs    05/05/20 1558 05/06/20 0847  HGB 11.5* 11.4*  HCT 35.0* 34.9*    Assessment/Plan: BP improved on procardia XL 60mg  po BID 120/80 last pm and just before dose 139/96 this AM.  Will add labetalol 100mg  po BID to regimen and monitor BP throughout day.  Possible d/c this evening if BP improve.  Baby doing well on RA in NICU    LOS: 16 days   05/08/20 05/07/2020, 9:58 AM

## 2020-05-08 ENCOUNTER — Ambulatory Visit: Payer: Self-pay

## 2020-05-08 NOTE — Lactation Note (Signed)
This note was copied from a baby's chart. Lactation Consultation Note  Patient Name: Natalie Dunn Date: 05/08/2020 Reason for consult: Follow-up assessment;NICU baby;Preterm <34wks  LC to room for f/u visit. Mom and baby were sts during visit. Mother is 3 days postpartum with +breast changes today. She continues to pump q3 hours. She has necessary pump supplies and is using a spectra 2 pump at home. She declines [facility] referral but will let LC know if she changes her mind. Mother offered the opportunity to ask questions. All concerns addressed during visit. Will plan f/u visit prn.  Interventions Interventions: Breast feeding basics reviewed;Skin to skin;Hand express;DEBP    Consult Status Consult Status: Follow-up Date: 05/09/20 Follow-up type: In-patient    Elder Negus 05/08/2020, 3:22 PM

## 2020-05-12 ENCOUNTER — Ambulatory Visit: Payer: Self-pay

## 2020-05-12 NOTE — Lactation Note (Signed)
This note was copied from a baby's chart. Lactation Consultation Note  Patient Name: Natalie Dunn LOVFI'E Date: 05/12/2020 Reason for consult: Follow-up assessment;Preterm <34wks;NICU baby  LC to room for f/u visit. Mom continues to pump q 2-3 hours and yields volumes between 1.5 and 3 oz. LC encouraged sts. No breast pumping supplies needed at this time. Will plan f/u visit.   Interventions Interventions: Breast feeding basics reviewed;Skin to skin;DEBP  Consult Status Consult Status: Follow-up Date: 05/13/20 Follow-up type: In-patient    Elder Negus 05/12/2020, 4:39 PM

## 2020-05-26 ENCOUNTER — Ambulatory Visit: Payer: Self-pay

## 2020-05-26 NOTE — Lactation Note (Signed)
This note was copied from a baby's chart. Lactation Consultation Note  Patient Name: Natalie Dunn [id number] Date: 05/26/2020 Reason for consult: Follow-up assessment;NICU baby  LC to room for follow up visit.  Mom holding sleeping baby. She continues to pump and yield volume that meets infant's needs. Patient was provided with the opportunity to ask questions. All concerns were addressed.  Will plan follow up visit.   Consult Status Consult Status: Follow-up Date: 05/27/20 Follow-up type: In-patient    Elder Negus 05/26/2020, 12:34 PM

## 2020-06-01 ENCOUNTER — Ambulatory Visit: Payer: Self-pay

## 2020-06-01 NOTE — Lactation Note (Signed)
This note was copied from a baby's chart. Lactation Consultation Note  Patient Name: Natalie Dunn [id number] Date: 06/01/2020 Reason for consult: Follow-up assessment  LC Follow Up Visit:  Arrived to find mother holding infant, quiet and content in her arms.  Mother stated that he had just finished bottle feeding  21 mls and was gavage feeding the remaining 23 mls.  Mother has been pumping approximately every three hours during the day and extending the pumping time to 4 hours at night.  She averages 180 mls/pumping session.  At one of her recent morning sessions mother was able to pump 240 mls.  Praised her for her continued efforts with pumping.  She had no questions/concerns.     Maternal Data    Feeding Feeding Type: Breast Milk Nipple Type: Dr. Cline Crock  LATCH Score                   Interventions    Lactation Tools Discussed/Used     Consult Status Consult Status: Follow-up Date: 06/02/20 Follow-up type: In-patient    Jakaria Lavergne R Janyah Singleterry 06/01/2020, 3:20 PM

## 2021-05-07 ENCOUNTER — Other Ambulatory Visit: Payer: Self-pay

## 2021-05-07 ENCOUNTER — Inpatient Hospital Stay (HOSPITAL_COMMUNITY)
Admission: AD | Admit: 2021-05-07 | Discharge: 2021-05-08 | Disposition: A | Discharge status: Home / Self Care | Payer: Medicaid Other | Attending: Obstetrics & Gynecology | Admitting: Obstetrics & Gynecology

## 2021-05-07 ENCOUNTER — Encounter (HOSPITAL_COMMUNITY): Payer: Self-pay

## 2021-05-07 ENCOUNTER — Inpatient Hospital Stay (HOSPITAL_COMMUNITY): Payer: Medicaid Other

## 2021-05-07 DIAGNOSIS — O209 Hemorrhage in early pregnancy, unspecified: Secondary | ICD-10-CM

## 2021-05-07 DIAGNOSIS — Z87891 Personal history of nicotine dependence: Secondary | ICD-10-CM | POA: Insufficient documentation

## 2021-05-07 DIAGNOSIS — O3680X0 Pregnancy with inconclusive fetal viability, not applicable or unspecified: Secondary | ICD-10-CM | POA: Diagnosis not present

## 2021-05-07 DIAGNOSIS — O4690 Antepartum hemorrhage, unspecified, unspecified trimester: Secondary | ICD-10-CM

## 2021-05-07 DIAGNOSIS — B3731 Acute candidiasis of vulva and vagina: Secondary | ICD-10-CM | POA: Insufficient documentation

## 2021-05-07 DIAGNOSIS — O98811 Other maternal infectious and parasitic diseases complicating pregnancy, first trimester: Secondary | ICD-10-CM | POA: Diagnosis not present

## 2021-05-07 DIAGNOSIS — O26851 Spotting complicating pregnancy, first trimester: Secondary | ICD-10-CM | POA: Diagnosis not present

## 2021-05-07 DIAGNOSIS — Z3A01 Less than 8 weeks gestation of pregnancy: Secondary | ICD-10-CM | POA: Diagnosis not present

## 2021-05-07 LAB — CBC
HCT: 38.5 % (ref 36.0–46.0)
Hemoglobin: 12.4 g/dL (ref 12.0–15.0)
MCH: 28.8 pg (ref 26.0–34.0)
MCHC: 32.2 g/dL (ref 30.0–36.0)
MCV: 89.3 fL (ref 80.0–100.0)
Platelets: 264 10*3/uL (ref 150–400)
RBC: 4.31 MIL/uL (ref 3.87–5.11)
RDW: 12.7 % (ref 11.5–15.5)
WBC: 8.6 10*3/uL (ref 4.0–10.5)
nRBC: 0 % (ref 0.0–0.2)

## 2021-05-07 LAB — POC URINE PREG, ED: Preg Test, Ur: POSITIVE — AB

## 2021-05-07 LAB — ABO/RH: ABO/RH(D): O POS

## 2021-05-07 NOTE — MAU Note (Addendum)
PT SAYS VB STARTED ON Sunday- AS SPOTTING  PAD IN TRIAGE - SPOT OF DARK Perkey CRAMPING STARTED TODAY-3 VAG IRRITATION - INSIDE VAG - WHEN TOUCHED  LAST SEX- 04-19-21

## 2021-05-07 NOTE — ED Triage Notes (Signed)
Pt states that she was having some vaginal irritation this week, took two home test that were positive and today began to have some vaginal bleeding

## 2021-05-07 NOTE — ED Provider Notes (Addendum)
Emergency Medicine Provider OB Triage Evaluation Note  Natalie Dunn is a 30 y.o. female, [phone], at Unknown gestation who presents to the emergency department with complaints of spotting and bleeding.  She reports that she had two positive tests at home.  She started having spotting and bleeding yesterday.  No imaging this pregnancy.    First day of LMP was 03/31/21.     Review of  Systems  Positive: Spotting and bleeding Negative: syncope  Physical Exam  BP (!) 153/108   Pulse 99   Temp 98.8 F (37.1 C) (Oral)   Resp 20   LMP 03/11/2021   SpO2 98%  General: Awake, no distress  HEENT: Atraumatic  Resp: Normal effort  Cardiac: Normal rate Abd: Nondistended, nontender  MSK: Moves all extremities without difficulty Neuro: Speech clear  Medical Decision Making  Pt evaluated for pregnancy concern and is stable for transfer to MAU. Pt is in agreement with plan for transfer.  9:49 PM Discussed with MAU APP, Danielle who accepts patient in transfer.  952 PM RN is busy.  I called MAU RN to provide nursing report as to not delay care.   954 pm I called transport to transport patient to MAU.  Clinical Impression   1. Vaginal bleeding in pregnancy        Norman Clay 05/07/21 2149    Cristina Gong, PA-C 05/07/21 2152    Cristina Gong, PA-C 05/07/21 2154    Milagros Loll, MD 05/08/21 [phone]

## 2021-05-08 ENCOUNTER — Other Ambulatory Visit: Payer: Self-pay

## 2021-05-08 DIAGNOSIS — O3680X0 Pregnancy with inconclusive fetal viability, not applicable or unspecified: Secondary | ICD-10-CM

## 2021-05-08 DIAGNOSIS — B3731 Acute candidiasis of vulva and vagina: Secondary | ICD-10-CM

## 2021-05-08 DIAGNOSIS — O209 Hemorrhage in early pregnancy, unspecified: Secondary | ICD-10-CM

## 2021-05-08 LAB — WET PREP, GENITAL
Sperm: NONE SEEN
Trich, Wet Prep: NONE SEEN

## 2021-05-08 LAB — HCG, QUANTITATIVE, PREGNANCY: hCG, Beta Chain, Quant, S: 198 m[IU]/mL — ABNORMAL HIGH (ref <5)

## 2021-05-08 MED ORDER — TERCONAZOLE 0.4 % VA CREA: 1.0000 | TOPICAL_CREAM | Freq: Every day | VAGINAL | 0 refills | Status: DC

## 2021-05-08 NOTE — Discharge Instructions (Signed)

## 2021-05-08 NOTE — MAU Provider Note (Addendum)
Chief Complaint:  Vaginal Bleeding    HPI: Natalie Dunn is a 30 y.o. [phone] at [redacted]w[redacted]d who presents to maternity admissions reporting vaginal spotting. Patient reports on Sunday she started having some vaginal irritation and spotting. Reports it went away but returned today. Today it was concerning as it was a little heavier than it had been previously. She denies pain. No urinary s/s.    Pregnancy Course:   Past Medical History:  Diagnosis Date   SVD (spontaneous vaginal delivery) 11/2012   x 1   SVD (spontaneous vaginal delivery) 06/26/2016   OB History  Gravida Para Term Preterm AB Living  8 3 0 3 3 3   SAB IAB Ectopic Multiple Live Births  2 0 0 0 3    # Outcome Date GA Lbr Len/2nd Weight Sex Delivery Anes PTL Lv  8 Current           7 Preterm 05/05/20 [redacted]w[redacted]d  1520 g M Vag-Spont None  LIV  6 Preterm 06/26/16 [redacted]w[redacted]d 858:19 / 00:04 1960 g M Vag-Spont Local  LIV  5 AB 02/08/13          4 Preterm 11/14/12 [redacted]w[redacted]d 07:10 / 00:15 1050 g M Vag-Spont EPI  LIV  3 Gravida           2 SAB           1 SAB            Past Surgical History:  Procedure Laterality Date   CERVICAL CERCLAGE N/A 09/19/2012   Procedure: CERCLAGE CERVICAL;  Surgeon: 11/19/2012, MD;  Location: [facility];  Service: Gynecology;  Laterality: N/A;   CERVICAL CERCLAGE N/A 02/08/2016   Procedure: CERCLAGE CERVICAL;  Surgeon: 02/10/2016, MD;  Location: [facility];  Service: Gynecology;  Laterality: N/A;   CERVICAL CERCLAGE N/A 01/03/2020   Procedure: CERCLAGE CERVICAL;  Surgeon: 01/05/2020, MD;  Location: [facility];  Service: Gynecology;  Laterality: N/A;   Family History  Problem Relation Age of Onset   Diabetes Mother    Hypertension Mother    Diabetes Father    Hypertension Father    Social History   Tobacco Use   Smoking status: Former   Smokeless tobacco: Never  Huel Cote Use: Never used  Substance Use Topics   Alcohol use: No   Drug use: No   No Known Allergies No medications prior  to admission.    I have reviewed patient's Past Medical Hx, Surgical Hx, Family Hx, Social Hx, medications and allergies.   ROS:  Review of Systems  Constitutional: Negative.   Respiratory: Negative.    Cardiovascular: Negative.   Gastrointestinal: Negative.   Genitourinary:  Positive for vaginal discharge. Negative for dysuria, frequency and urgency.       Vaginal irritation   Musculoskeletal: Negative.   Neurological: Negative.   Psychiatric/Behavioral: Negative.     Physical Exam  Patient Vitals for the past 24 hrs:  BP Temp Temp src Pulse Resp SpO2 Height Weight  05/07/21 2339 137/82 98.2 F (36.8 C) Oral 91 18 -- 5\' 7"  (1.702 m) 117.2 kg  05/07/21 1955 (!) 153/108 98.8 F (37.1 C) Oral 99 20 98 % -- --   Constitutional: well-developed, well-nourished female in no acute distress.  Cardiovascular: normal rate Respiratory: normal effort GI: abd soft, non-tender MS: extremities nontender, no edema, normal ROM Neurologic: alert and oriented x 4.  Pelvic: blind swabs obtained     Labs: Results for orders placed  or performed during the hospital encounter of 05/07/21 (from the past 24 hour(s))  POC Urine Pregnancy, ED (not at [facility])     Status: Abnormal   Collection Time: 05/07/21  9:46 PM  Result Value Ref Range   Preg Test, Ur POSITIVE (A) NEGATIVE  Wet prep, genital     Status: Abnormal   Collection Time: 05/07/21 10:25 PM  Result Value Ref Range   Yeast Wet Prep HPF POC PRESENT (A) NONE SEEN   Trich, Wet Prep NONE SEEN NONE SEEN   Clue Cells Wet Prep HPF POC PRESENT (A) NONE SEEN   WBC, Wet Prep HPF POC MANY (A) NONE SEEN   Sperm NONE SEEN   CBC     Status: None   Collection Time: 05/07/21 10:42 PM  Result Value Ref Range   WBC 8.6 4.0 - 10.5 K/uL   RBC 4.31 3.87 - 5.11 MIL/uL   Hemoglobin 12.4 12.0 - 15.0 g/dL   HCT 43.8 88.7 - 57.9 %   MCV 89.3 80.0 - 100.0 fL   MCH 28.8 26.0 - 34.0 pg   MCHC 32.2 30.0 - 36.0 g/dL   RDW 72.8 20.6 - 01.5 %   Platelets 264  150 - 400 K/uL   nRBC 0.0 0.0 - 0.2 %  hCG, quantitative, pregnancy     Status: Abnormal   Collection Time: 05/07/21 10:42 PM  Result Value Ref Range   hCG, Beta Chain, Quant, S 198 (H) <5 mIU/mL  ABO/Rh     Status: None   Collection Time: 05/07/21 10:42 PM  Result Value Ref Range   ABO/RH(D) O POS    No rh immune globuloin      NOT A RH IMMUNE GLOBULIN CANDIDATE, PT RH POSITIVE Performed at [facility], [address], [place], [place] [address]     Imaging:  US OB LESS THAN 14 WEEKS WITH OB TRANSVAGINAL  Result Date: 05/08/2021 CLINICAL DATA:  Initial evaluation for acute vaginal bleeding for 5 days, early pregnancy. EXAM: OBSTETRIC <14 WK [place] AND TRANSVAGINAL OB US TECHNIQUE: Both transabdominal and transvaginal ultrasound examinations were performed for complete evaluation of the gestation as well as the maternal uterus, adnexal regions, and pelvic cul-de-sac. Transvaginal technique was performed to assess early pregnancy. COMPARISON:  None available. FINDINGS: Intrauterine gestational sac: Negative. Endometrial stripe measures 13 mm in thickness. Yolk sac:  Negative. Embryo:  Negative. Cardiac Activity: Negative. Heart Rate: N/A Subchorionic hemorrhage:  None visualized. Maternal uterus/adnexae: Ovaries within normal limits. No adnexal mass. Suspected trace free fluid within the pelvis. IMPRESSION: 1. Early pregnancy with no discrete IUP or adnexal mass identified. Finding is consistent with a pregnancy of unknown anatomic location. Differential considerations include IUP to early to visualize, recent SAB, or possibly occult ectopic pregnancy. Close clinical monitoring with serial beta HCGs and close interval follow-up ultrasound recommended as clinically warranted. 2. No other acute maternal uterine or adnexal abnormality. Electronically Signed   By: Rise Mu M.D.   On: 05/08/2021 00:21    MAU Course: Orders Placed This Encounter  Procedures   Wet prep, genital   US  OB LESS THAN 14 WEEKS WITH OB TRANSVAGINAL   CBC   hCG, quantitative, pregnancy   POC Urine Pregnancy, ED (not at [facility])   ABO/Rh   Discharge patient   No orders of the defined types were placed in this encounter.   MDM: CBC, HCG ABO/RH, O Pos, rhogam not indicated Wet prep, GC/CT collected, will call patient with wet prep  results [place] results as above  Assessment: 1. Vaginal bleeding in pregnancy   2. Vaginal bleeding affecting early pregnancy   3. Pregnancy of unknown anatomic location   4. Vaginal yeast infection     Plan: Discharge home in stable condition  Patient to follow up at [facility] on Monday for repeat bHCG Strict return precautions reviewed Patient was contacted after visit regarding wet prep results. Rx for terazol cream sent to pharmacy    Follow-up Information     [facility] Follow up.   Specialty: Obstetrics and Gynecology Why: on Monday for repeat bHCG. Return to MAU as needed. Contact information: [address] [place] [address] [phone]                Allergies as of 05/08/2021   No Known Allergies      Medication List     STOP taking these medications    ibuprofen 600 MG tablet Commonly known as: ADVIL   labetalol 100 MG tablet Commonly known as: NORMODYNE   NIFEdipine 60 MG 24 hr tablet Commonly known as: ADALAT CC       TAKE these medications    acetaminophen 325 MG tablet Commonly known as: Tylenol Take 2 tablets (650 mg total) by mouth every 4 (four) hours as needed (for pain scale < 4).   PRENATAL PO Take 1 tablet by mouth at bedtime.         Camelia Eng, CNM 05/08/2021 3:24 AM

## 2021-05-10 ENCOUNTER — Other Ambulatory Visit (INDEPENDENT_AMBULATORY_CARE_PROVIDER_SITE_OTHER): Payer: Medicaid Other

## 2021-05-10 ENCOUNTER — Other Ambulatory Visit: Payer: Self-pay

## 2021-05-10 DIAGNOSIS — O98819 Other maternal infectious and parasitic diseases complicating pregnancy, unspecified trimester: Secondary | ICD-10-CM

## 2021-05-10 DIAGNOSIS — A749 Chlamydial infection, unspecified: Secondary | ICD-10-CM

## 2021-05-10 DIAGNOSIS — O3680X0 Pregnancy with inconclusive fetal viability, not applicable or unspecified: Secondary | ICD-10-CM

## 2021-05-10 LAB — GC/CHLAMYDIA PROBE AMP (~~LOC~~) NOT AT ARMC
Chlamydia: POSITIVE — AB
Comment: NEGATIVE
Comment: NORMAL
Neisseria Gonorrhea: NEGATIVE

## 2021-05-10 LAB — BETA HCG QUANT (REF LAB): hCG Quant: 439 m[IU]/mL

## 2021-05-10 MED ORDER — AZITHROMYCIN 250 MG PO TABS: 1000.0000 mg | ORAL_TABLET | Freq: Once | ORAL | 0 refills | Status: DC

## 2021-05-10 MED ORDER — AZITHROMYCIN 250 MG PO TABS: 1000.0000 mg | ORAL_TABLET | Freq: Once | ORAL | 0 refills | Status: AC

## 2021-05-10 NOTE — Progress Notes (Unsigned)
4:53 stat bhcg result not available. Called Dr. Crissie Reese, first attending and notified per protocol late stat results should be called to him for him to follow up result and plan with patient. Chart routed to him . Areli Jowett,RN

## 2021-05-10 NOTE — Progress Notes (Signed)
Patient was contacted regarding +chlamydia results. 2 patient identifiers were given prior to discussing results. Azithromycin 1g sent to patients pharmacy. Partner(s) to go to health dept or PCP to receive treatment. Avoid intercourse x7 days after all party's have received treatment. All questions were answered. Patient verbalizes understanding.    Brand Males, CNM 05/10/21 1:00 PM

## 2021-05-10 NOTE — Progress Notes (Unsigned)
Wynell here for stat bhcg. Denies pain. C/o light spotting. Explained we will draw stat bhcg and have her leave office. She will be called with results in about 2 hours or it may be longer - after results received and reviewed by provider. Informed her will likely be doctor on call that calls her due to results will return late. Her phone number was reviewed. She voices understanding. [place] Cres[place],RN

## 2021-05-11 ENCOUNTER — Telehealth: Payer: Self-pay | Admitting: Family Medicine

## 2021-05-11 NOTE — Progress Notes (Signed)
Chart reviewed for nurse visit. Agree with plan of care.   Normal rise, see TE from earlier today.   Venora Maples, MD 05/11/21 12:40 PM

## 2021-05-11 NOTE — Telephone Encounter (Signed)
Patient called and identified by two markers.  Discussed hcg level rising appropriately. She has an appt to establish prenatal care with [facility] tomorrow. Reviewed warning signs of ectopic/MAU return precautions. All questions answered.

## 2021-05-25 ENCOUNTER — Other Ambulatory Visit: Payer: Self-pay

## 2021-05-25 ENCOUNTER — Encounter (HOSPITAL_COMMUNITY): Payer: Self-pay | Admitting: Obstetrics & Gynecology

## 2021-05-25 ENCOUNTER — Inpatient Hospital Stay (HOSPITAL_COMMUNITY)
Admission: AD | Admit: 2021-05-25 | Discharge: 2021-05-25 | Disposition: A | Discharge status: Home / Self Care | Payer: No Typology Code available for payment source | Attending: Obstetrics and Gynecology | Admitting: Obstetrics and Gynecology

## 2021-05-25 ENCOUNTER — Inpatient Hospital Stay (HOSPITAL_COMMUNITY): Payer: No Typology Code available for payment source

## 2021-05-25 DIAGNOSIS — O26891 Other specified pregnancy related conditions, first trimester: Secondary | ICD-10-CM | POA: Diagnosis not present

## 2021-05-25 DIAGNOSIS — O021 Missed abortion: Secondary | ICD-10-CM | POA: Insufficient documentation

## 2021-05-25 DIAGNOSIS — Z6790 Unspecified blood type, Rh positive: Secondary | ICD-10-CM | POA: Insufficient documentation

## 2021-05-25 DIAGNOSIS — Z3A01 Less than 8 weeks gestation of pregnancy: Secondary | ICD-10-CM | POA: Insufficient documentation

## 2021-05-25 DIAGNOSIS — R1031 Right lower quadrant pain: Secondary | ICD-10-CM | POA: Insufficient documentation

## 2021-05-25 LAB — WET PREP, GENITAL
Clue Cells Wet Prep HPF POC: NONE SEEN
Sperm: NONE SEEN
Trich, Wet Prep: NONE SEEN
WBC, Wet Prep HPF POC: >=10 — AB (ref <10)
Yeast Wet Prep HPF POC: NONE SEEN

## 2021-05-25 LAB — CBC
HCT: 40 % (ref 36.0–46.0)
Hemoglobin: 13 g/dL (ref 12.0–15.0)
MCH: 28.6 pg (ref 26.0–34.0)
MCHC: 32.5 g/dL (ref 30.0–36.0)
MCV: 87.9 fL (ref 80.0–100.0)
Platelets: 267 10*3/uL (ref 150–400)
RBC: 4.55 MIL/uL (ref 3.87–5.11)
RDW: 12.9 % (ref 11.5–15.5)
WBC: 9.3 10*3/uL (ref 4.0–10.5)
nRBC: 0 % (ref 0.0–0.2)

## 2021-05-25 LAB — URINALYSIS, ROUTINE W REFLEX MICROSCOPIC
Bacteria, UA: NONE SEEN
Bilirubin Urine: NEGATIVE
Glucose, UA: NEGATIVE mg/dL
Ketones, ur: 20 mg/dL — AB
Leukocytes,Ua: NEGATIVE
Nitrite: NEGATIVE
Protein, ur: NEGATIVE mg/dL
Specific Gravity, Urine: 1.013 (ref 1.005–1.030)
pH: 5 (ref 5.0–8.0)

## 2021-05-25 LAB — HCG, QUANTITATIVE, PREGNANCY: hCG, Beta Chain, Quant, S: 469 m[IU]/mL — ABNORMAL HIGH (ref <5)

## 2021-05-25 MED ORDER — IBUPROFEN 600 MG PO TABS: 600.0000 mg | ORAL_TABLET | Freq: Four times a day (QID) | ORAL | 0 refills | Status: AC | PRN

## 2021-05-25 MED ORDER — MISOPROSTOL 200 MCG PO TABS: 800.0000 ug | ORAL_TABLET | Freq: Once | ORAL | 0 refills | Status: AC

## 2021-05-25 MED ORDER — PROMETHAZINE HCL 25 MG PO TABS
12.5000 mg | ORAL_TABLET | Freq: Four times a day (QID) | ORAL | 0 refills | Status: AC | PRN
Start: 2021-05-25 — End: ?

## 2021-05-25 MED ORDER — OXYCODONE-ACETAMINOPHEN 5-325 MG PO TABS
1.0000 | ORAL_TABLET | ORAL | 0 refills | Status: AC | PRN
Start: 2021-05-25 — End: ?

## 2021-05-25 NOTE — MAU Provider Note (Signed)
History     CSN: [id number]  Arrival date and time: 05/25/21 1242   Event Date/Time   First Provider Initiated Contact with Patient 05/25/21 1327      Chief Complaint  Patient presents with   Vaginal Bleeding   Abdominal Pain   30 y.o. [id number] @[redacted]w[redacted]d  wks presenting with RLQ pain and VB. Reports onset of VB about 1 week ago. Was heavy like a period and she passed some tissue. Bleeding is lighter now and only when she wipes. Pain started last night. Describes as intermittent and sharp. Rates 7/10. Has not treated it. Denies urinary sx. No fevers. Was treated for Chlamydia 2 weeks ago, partner was also treated.    OB History     Gravida  7   Para  3   Term  0   Preterm  3   AB  3   Living  3      SAB  2   IAB  0   Ectopic  0   Multiple  0   Live Births  3           Past Medical History:  Diagnosis Date   SVD (spontaneous vaginal delivery) 11/2012   x 1   SVD (spontaneous vaginal delivery) 06/26/2016    Past Surgical History:  Procedure Laterality Date   CERVICAL CERCLAGE N/A 09/19/2012   Procedure: CERCLAGE CERVICAL;  Surgeon: Oliver Pila, MD;  Location: [facility];  Service: Gynecology;  Laterality: N/A;   CERVICAL CERCLAGE N/A 02/08/2016   Procedure: CERCLAGE CERVICAL;  Surgeon: Huel Cote, MD;  Location: [facility];  Service: Gynecology;  Laterality: N/A;   CERVICAL CERCLAGE N/A 01/03/2020   Procedure: CERCLAGE CERVICAL;  Surgeon: Huel Cote, MD;  Location: [facility];  Service: Gynecology;  Laterality: N/A;    Family History  Problem Relation Age of Onset   Diabetes Mother    Hypertension Mother    Diabetes Father    Hypertension Father     Social History   Tobacco Use   Smoking status: Former   Smokeless tobacco: Never  Building services engineer Use: Never used  Substance Use Topics   Alcohol use: No   Drug use: No    Allergies: No Known Allergies  Medications Prior to Admission  Medication Sig Dispense Refill Last Dose    acetaminophen (TYLENOL) 325 MG tablet Take 2 tablets (650 mg total) by mouth every 4 (four) hours as needed (for pain scale < 4). 30 tablet 0    Prenatal Vit-Fe Fumarate-FA (PRENATAL PO) Take 1 tablet by mouth at bedtime.      terconazole (TERAZOL 7) 0.4 % vaginal cream Place 1 applicator vaginally at bedtime. 45 g 0     Review of Systems  Constitutional:  Negative for fever.  Gastrointestinal:  Positive for abdominal pain.  Genitourinary:  Positive for vaginal bleeding. Negative for dysuria, frequency, urgency and vaginal discharge.  Physical Exam   Blood pressure 127/87, pulse 90, temperature 98.6 F (37 C), temperature source Oral, resp. rate 19, height 5\' 7"  (1.702 m), weight 117.6 kg, last menstrual period 03/31/2021, SpO2 99 %, unknown if currently breastfeeding.  Physical Exam Vitals and nursing note reviewed.  Constitutional:      General: She is not in acute distress (appears comfortable).    Appearance: Normal appearance.  HENT:     Head: Normocephalic and atraumatic.  Cardiovascular:     Rate and Rhythm: Normal rate.  Pulmonary:     Effort: Pulmonary  effort is normal. No respiratory distress.  Abdominal:     General: There is no distension.     Palpations: Abdomen is soft. There is no mass.     Tenderness: There is no abdominal tenderness. There is no guarding or rebound.     Hernia: No hernia is present.  Musculoskeletal:        General: Normal range of motion.     Cervical back: Normal range of motion.  Skin:    General: Skin is warm and dry.  Neurological:     General: No focal deficit present.     Mental Status: She is alert and oriented to person, place, and time.  Psychiatric:        Mood and Affect: Mood normal.        Behavior: Behavior normal.   Results for orders placed or performed during the hospital encounter of 05/25/21 (from the past 24 hour(s))  CBC     Status: None   Collection Time: 05/25/21  1:16 PM  Result Value Ref Range   WBC 9.3 4.0 -  10.5 K/uL   RBC 4.55 3.87 - 5.11 MIL/uL   Hemoglobin 13.0 12.0 - 15.0 g/dL   HCT 03.4 74.2 - 59.5 %   MCV 87.9 80.0 - 100.0 fL   MCH 28.6 26.0 - 34.0 pg   MCHC 32.5 30.0 - 36.0 g/dL   RDW 63.8 75.6 - 43.3 %   Platelets 267 150 - 400 K/uL   nRBC 0.0 0.0 - 0.2 %  hCG, quantitative, pregnancy     Status: Abnormal   Collection Time: 05/25/21  1:16 PM  Result Value Ref Range   hCG, Beta Chain, Quant, S 469 (H) <5 mIU/mL  Urinalysis, Routine w reflex microscopic Urine, Clean Catch     Status: Abnormal   Collection Time: 05/25/21  1:54 PM  Result Value Ref Range   Color, Urine YELLOW YELLOW   APPearance CLEAR CLEAR   Specific Gravity, Urine 1.013 1.005 - 1.030   pH 5.0 5.0 - 8.0   Glucose, UA NEGATIVE NEGATIVE mg/dL   Hgb urine dipstick LARGE (A) NEGATIVE   Bilirubin Urine NEGATIVE NEGATIVE   Ketones, ur 20 (A) NEGATIVE mg/dL   Protein, ur NEGATIVE NEGATIVE mg/dL   Nitrite NEGATIVE NEGATIVE   Leukocytes,Ua NEGATIVE NEGATIVE   RBC / HPF 0-5 0 - 5 RBC/hpf   WBC, UA 0-5 0 - 5 WBC/hpf   Bacteria, UA NONE SEEN NONE SEEN   Squamous Epithelial / LPF 0-5 0 - 5  Wet prep, genital     Status: Abnormal   Collection Time: 05/25/21  1:54 PM   Specimen: PATH Cytology Cervicovaginal Ancillary Only  Result Value Ref Range   Yeast Wet Prep HPF POC NONE SEEN NONE SEEN   Trich, Wet Prep NONE SEEN NONE SEEN   Clue Cells Wet Prep HPF POC NONE SEEN NONE SEEN   WBC, Wet Prep HPF POC >=10 (A) <10   Sperm NONE SEEN    US OB Transvaginal  Result Date: 05/25/2021 CLINICAL DATA:  Vaginal bleeding and pregnancy EXAM: TRANSVAGINAL OB ULTRASOUND TECHNIQUE: Transvaginal ultrasound was performed for complete evaluation of the gestation as well as the maternal uterus, adnexal regions, and pelvic cul-de-sac. COMPARISON:  Multiple prior ultrasound FINDINGS: Intrauterine gestational sac: There is a small anechoic structure with irregular borders and internal minimal hyperechoic internal tissue within the cervix  measuring 1.0 x 0.6 x 2.2 cm. Yolk sac:  Not Visualized. Embryo:  Not Visualized. Cardiac Activity:  Not Visualized. Heart Rate: Not applicable MSD: Not applicable CRL:   Not applicable Subchorionic hemorrhage:  Not applicable Maternal uterus/adnexae: Normal bilateral ovaries. Moderate volume free fluid in the pelvis. IMPRESSION: Small amount of fluid with internal hyperechoic tissue located within the cervix measuring 1.0 x 0.6 x 2.2 cm, possibly blood products and/or fetal tissue. This could represent an ongoing spontaneous abortion. Moderate volume free fluid in the pelvis. Electronically Signed   By: Caprice Renshaw M.D.   On: 05/25/2021 15:38    MAU Course  Procedures  MDM Seen in MAU on 10/28 with qhcg of 198 and no IUP on [place]. Repeat qhcg on 10/31 was 439. Labs and [place] ordered and reviewed. Consult with Dr. Debroah Loop, most likely failed pregnancy, will offer Cytotec.  Early Intrauterine Pregnancy Failure Protocol X  Documented intrauterine pregnancy failure less than or equal to [redacted] weeks gestation  X  No serious current illness  X  Baseline Hgb greater than or equal to 10g/dl  X  Patient has easily accessible transportation to the hospital  X  Clear preference  X  Practitioner/physician deems patient reliable  X  Counseling by practitioner or physician  X  Patient education by RN  X  Cytotec 800 mcg po-prescribed X   Ibuprofen 600 mg 1 tablet by mouth every 6 hours as needed - prescribed  X   Percocet 5/325 1-2 tabs every 6 hours as needed - prescribed  X   Phenergan 25 mg by mouth every 6 hours as needed for nausea - prescribed  Pt verbalizes that she lives close to the hospital and has transportation readily available. Pt appears reliable and verbalizes understanding and agrees with plan of care. Risks, benefits, and procedure of medical management with Cytotec were reviewed, including a success rate of about 90%, the need for another person to be at home with her, and to call/come in if she had  heavy bleeding, dizziness, or severe pain not relieved by medication. She will follow up in one week after Cytotec administration; if there has been no passage of the pregnancy, will consider repeat Cytotec vs D&E.  Patient was advised to call or come in for evaluation for any concerning symptoms; bleeding precautions were strictly emphasized.   Stable for discharge. Assessment and Plan   1. Missed abortion   2. Blood type, Rh positive    Discharge home Follow up at [facility] in 1 week Pelvic rest Return precautions  Allergies as of 05/25/2021   No Known Allergies      Medication List     STOP taking these medications    terconazole 0.4 % vaginal cream Commonly known as: TERAZOL 7       TAKE these medications    acetaminophen 325 MG tablet Commonly known as: Tylenol Take 2 tablets (650 mg total) by mouth every 4 (four) hours as needed (for pain scale < 4).   ibuprofen 600 MG tablet Commonly known as: ADVIL Take 1 tablet (600 mg total) by mouth every 6 (six) hours as needed for mild pain, moderate pain or cramping.   misoprostol 200 MCG tablet Commonly known as: Cytotec Take 4 tablets (800 mcg total) by mouth once for 1 dose.   oxyCODONE-acetaminophen 5-325 MG tablet Commonly known as: Percocet Take 1 tablet by mouth every 4 (four) hours as needed for severe pain.   PRENATAL PO Take 1 tablet by mouth at bedtime.   promethazine 25 MG tablet Commonly known as: PHENERGAN Take 0.5-1 tablets (12.5-25 mg  total) by mouth every 6 (six) hours as needed for nausea or vomiting.        Donette Larry, CNM 05/25/2021, 1:48 PM

## 2021-05-25 NOTE — Discharge Instructions (Signed)

## 2021-05-25 NOTE — MAU Note (Signed)
Presents with c/o VB and right lower abdominal and back pain.  States VB was heavier last week but has started to decrease, but pain is new onset that began last night.  States seen last month for spotting and cramping and HCG level done.  States HCG levels rose appropriately but nothing seen on U/S or "unknown location".

## 2021-05-26 LAB — GC/CHLAMYDIA PROBE AMP (~~LOC~~) NOT AT ARMC
Chlamydia: NEGATIVE
Comment: NEGATIVE
Comment: NORMAL
Neisseria Gonorrhea: NEGATIVE

## 2022-12-03 ENCOUNTER — Inpatient Hospital Stay (HOSPITAL_COMMUNITY)
Admission: AD | Admit: 2022-12-03 | Discharge: 2022-12-03 | Disposition: A | Discharge status: Home / Self Care | Payer: No Typology Code available for payment source | Attending: Obstetrics & Gynecology | Admitting: Obstetrics & Gynecology

## 2022-12-03 ENCOUNTER — Encounter (HOSPITAL_COMMUNITY): Payer: Self-pay | Admitting: Obstetrics & Gynecology

## 2022-12-03 DIAGNOSIS — N939 Abnormal uterine and vaginal bleeding, unspecified: Secondary | ICD-10-CM | POA: Insufficient documentation

## 2022-12-03 DIAGNOSIS — Z789 Other specified health status: Secondary | ICD-10-CM

## 2022-12-03 LAB — POCT PREGNANCY, URINE: Preg Test, Ur: NEGATIVE

## 2022-12-03 NOTE — MAU Note (Signed)
.  Natalie Dunn is a 32 y.o. at Unknown here in MAU reporting: started having vaginal bleeding on Thursday night along with clear, egg-yolk like discharge mixed with blood. She started having pelvic cramping (3/10). Her last period was 4/26 and she has not taken a HPT.   Vaginal Bleeding Vag. Bleeding: Small  Abnormal discharge/LOF: Membranes Color: Clear, Bloody Amount: Small and Amount: Small  Fetal Movement: N/A  LMP: Patient's last menstrual period was 11/04/2022. Pain score:  Pain Score: 3  Pain Location: Pelvis     Vitals:   12/03/22 1303  BP: 134/87  Pulse: 82  Resp: 16  Temp: (!) 97.2 F (36.2 C)  SpO2: 98%      FHT:  N/A  OB Office:  [facility] for [facility] Lab orders placed from triage: Urinalysis and POCT Pregnancy Test
